# Patient Record
Sex: Female | Born: 1945
Health system: Southern US, Community
[De-identification: ages and names within clinical notes are randomized; demographics above are authoritative.]

## PROBLEM LIST (undated history)

## (undated) DIAGNOSIS — G2581 Restless legs syndrome: Secondary | ICD-10-CM

## (undated) DIAGNOSIS — D649 Anemia, unspecified: Secondary | ICD-10-CM

## (undated) DIAGNOSIS — C801 Malignant (primary) neoplasm, unspecified: Secondary | ICD-10-CM

## (undated) DIAGNOSIS — E785 Hyperlipidemia, unspecified: Secondary | ICD-10-CM

## (undated) DIAGNOSIS — Z9221 Personal history of antineoplastic chemotherapy: Secondary | ICD-10-CM

## (undated) DIAGNOSIS — M1990 Unspecified osteoarthritis, unspecified site: Secondary | ICD-10-CM

## (undated) DIAGNOSIS — G629 Polyneuropathy, unspecified: Secondary | ICD-10-CM

## (undated) DIAGNOSIS — C50919 Malignant neoplasm of unspecified site of unspecified female breast: Secondary | ICD-10-CM

## (undated) DIAGNOSIS — I10 Essential (primary) hypertension: Secondary | ICD-10-CM

## (undated) DIAGNOSIS — E039 Hypothyroidism, unspecified: Secondary | ICD-10-CM

## (undated) DIAGNOSIS — R42 Dizziness and giddiness: Secondary | ICD-10-CM

## (undated) DIAGNOSIS — E119 Type 2 diabetes mellitus without complications: Secondary | ICD-10-CM

## (undated) DIAGNOSIS — Z923 Personal history of irradiation: Secondary | ICD-10-CM

## (undated) DIAGNOSIS — C20 Malignant neoplasm of rectum: Secondary | ICD-10-CM

## (undated) DIAGNOSIS — G4700 Insomnia, unspecified: Secondary | ICD-10-CM

## (undated) DIAGNOSIS — K219 Gastro-esophageal reflux disease without esophagitis: Secondary | ICD-10-CM

## (undated) DIAGNOSIS — Z8619 Personal history of other infectious and parasitic diseases: Secondary | ICD-10-CM

## (undated) HISTORY — DX: Personal history of antineoplastic chemotherapy: Z92.21

## (undated) HISTORY — PX: COLONOSCOPY: SHX174

## (undated) HISTORY — DX: Essential (primary) hypertension: I10

## (undated) HISTORY — PX: ABDOMINAL HYSTERECTOMY: SHX81

## (undated) HISTORY — DX: Malignant (primary) neoplasm, unspecified: C80.1

## (undated) HISTORY — DX: Anemia, unspecified: D64.9

## (undated) HISTORY — DX: Personal history of other infectious and parasitic diseases: Z86.19

## (undated) HISTORY — DX: Malignant neoplasm of rectum: C20

## (undated) HISTORY — DX: Hyperlipidemia, unspecified: E78.5

## (undated) HISTORY — DX: Personal history of irradiation: Z92.3

## (undated) HISTORY — DX: Type 2 diabetes mellitus without complications: E11.9

## (undated) HISTORY — DX: Gastro-esophageal reflux disease without esophagitis: K21.9

## (undated) HISTORY — DX: Insomnia, unspecified: G47.00

---

## 1972-07-03 HISTORY — PX: DILATION AND CURETTAGE OF UTERUS: SHX78

## 1998-07-03 HISTORY — PX: ROTATOR CUFF REPAIR: SHX139

## 2000-04-30 ENCOUNTER — Encounter: Admission: RE | Admit: 2000-04-30 | Discharge: 2000-04-30 | Payer: Self-pay

## 2000-06-22 ENCOUNTER — Encounter: Admission: RE | Admit: 2000-06-22 | Discharge: 2000-06-22 | Payer: Self-pay

## 2000-07-27 ENCOUNTER — Encounter: Admission: RE | Admit: 2000-07-27 | Discharge: 2000-07-27 | Payer: Self-pay

## 2000-07-30 ENCOUNTER — Encounter: Admission: RE | Admit: 2000-07-30 | Discharge: 2000-07-30 | Payer: Self-pay

## 2000-11-08 ENCOUNTER — Encounter: Admission: RE | Admit: 2000-11-08 | Discharge: 2000-11-08 | Payer: Self-pay

## 2000-12-31 ENCOUNTER — Encounter: Admission: RE | Admit: 2000-12-31 | Discharge: 2000-12-31 | Payer: Self-pay

## 2001-07-10 ENCOUNTER — Encounter: Admission: RE | Admit: 2001-07-10 | Discharge: 2001-07-10 | Payer: Self-pay

## 2007-07-04 DIAGNOSIS — C20 Malignant neoplasm of rectum: Secondary | ICD-10-CM

## 2007-07-04 DIAGNOSIS — C50919 Malignant neoplasm of unspecified site of unspecified female breast: Secondary | ICD-10-CM

## 2007-07-04 HISTORY — DX: Malignant neoplasm of rectum: C20

## 2007-07-04 HISTORY — DX: Malignant neoplasm of unspecified site of unspecified female breast: C50.919

## 2007-07-04 HISTORY — PX: BREAST LUMPECTOMY: SHX2

## 2007-07-04 HISTORY — PX: BREAST SURGERY: SHX581

## 2008-04-02 ENCOUNTER — Ambulatory Visit: Payer: Self-pay | Admitting: Internal Medicine

## 2008-04-24 ENCOUNTER — Ambulatory Visit: Payer: Self-pay | Admitting: Internal Medicine

## 2008-04-30 ENCOUNTER — Ambulatory Visit: Payer: Self-pay | Admitting: Internal Medicine

## 2008-05-01 ENCOUNTER — Ambulatory Visit (HOSPITAL_COMMUNITY): Admission: RE | Admit: 2008-05-01 | Discharge: 2008-05-01 | Payer: Self-pay | Admitting: Gastroenterology

## 2008-05-03 ENCOUNTER — Ambulatory Visit: Payer: Self-pay | Admitting: Radiation Oncology

## 2008-05-14 ENCOUNTER — Ambulatory Visit: Payer: Self-pay | Admitting: General Surgery

## 2008-05-19 ENCOUNTER — Ambulatory Visit: Payer: Self-pay | Admitting: General Surgery

## 2008-06-02 ENCOUNTER — Ambulatory Visit: Payer: Self-pay | Admitting: Radiation Oncology

## 2008-06-08 ENCOUNTER — Ambulatory Visit: Payer: Self-pay | Admitting: General Surgery

## 2008-06-17 ENCOUNTER — Ambulatory Visit: Payer: Self-pay | Admitting: Internal Medicine

## 2008-07-03 ENCOUNTER — Ambulatory Visit: Payer: Self-pay | Admitting: Radiation Oncology

## 2008-07-03 ENCOUNTER — Ambulatory Visit: Payer: Self-pay | Admitting: Internal Medicine

## 2008-07-03 HISTORY — PX: COLOSTOMY: SHX63

## 2008-08-03 ENCOUNTER — Ambulatory Visit: Payer: Self-pay | Admitting: Internal Medicine

## 2008-08-03 ENCOUNTER — Ambulatory Visit: Payer: Self-pay | Admitting: Radiation Oncology

## 2008-08-31 ENCOUNTER — Ambulatory Visit: Payer: Self-pay | Admitting: Radiation Oncology

## 2008-08-31 ENCOUNTER — Ambulatory Visit: Payer: Self-pay | Admitting: Internal Medicine

## 2008-10-01 ENCOUNTER — Ambulatory Visit: Payer: Self-pay | Admitting: Internal Medicine

## 2008-10-01 ENCOUNTER — Ambulatory Visit: Payer: Self-pay | Admitting: Radiation Oncology

## 2008-10-14 ENCOUNTER — Ambulatory Visit: Payer: Self-pay | Admitting: General Surgery

## 2008-10-21 ENCOUNTER — Inpatient Hospital Stay: Payer: Self-pay | Admitting: General Surgery

## 2008-12-31 ENCOUNTER — Ambulatory Visit: Payer: Self-pay | Admitting: Internal Medicine

## 2009-01-08 ENCOUNTER — Ambulatory Visit: Payer: Self-pay | Admitting: Internal Medicine

## 2009-01-31 ENCOUNTER — Ambulatory Visit: Payer: Self-pay | Admitting: Internal Medicine

## 2009-03-03 ENCOUNTER — Ambulatory Visit: Payer: Self-pay | Admitting: Internal Medicine

## 2009-04-02 ENCOUNTER — Ambulatory Visit: Payer: Self-pay | Admitting: Internal Medicine

## 2009-05-03 ENCOUNTER — Ambulatory Visit: Payer: Self-pay | Admitting: Internal Medicine

## 2009-06-02 ENCOUNTER — Ambulatory Visit: Payer: Self-pay | Admitting: Internal Medicine

## 2009-07-03 ENCOUNTER — Ambulatory Visit: Payer: Self-pay | Admitting: Internal Medicine

## 2009-07-21 ENCOUNTER — Ambulatory Visit: Payer: Self-pay | Admitting: General Surgery

## 2009-08-03 ENCOUNTER — Ambulatory Visit: Payer: Self-pay | Admitting: Internal Medicine

## 2009-08-31 ENCOUNTER — Ambulatory Visit: Payer: Self-pay | Admitting: Internal Medicine

## 2009-09-18 ENCOUNTER — Emergency Department: Payer: Self-pay | Admitting: Emergency Medicine

## 2009-10-01 ENCOUNTER — Ambulatory Visit: Payer: Self-pay | Admitting: Internal Medicine

## 2009-10-31 ENCOUNTER — Ambulatory Visit: Payer: Self-pay | Admitting: Internal Medicine

## 2009-12-01 ENCOUNTER — Ambulatory Visit: Payer: Self-pay | Admitting: Internal Medicine

## 2009-12-02 ENCOUNTER — Ambulatory Visit: Payer: Self-pay | Admitting: Orthopedic Surgery

## 2009-12-07 ENCOUNTER — Ambulatory Visit: Payer: Self-pay | Admitting: Internal Medicine

## 2009-12-14 ENCOUNTER — Ambulatory Visit: Payer: Self-pay | Admitting: Internal Medicine

## 2009-12-31 ENCOUNTER — Ambulatory Visit: Payer: Self-pay | Admitting: Internal Medicine

## 2010-01-31 ENCOUNTER — Ambulatory Visit: Payer: Self-pay | Admitting: Internal Medicine

## 2010-02-01 ENCOUNTER — Ambulatory Visit: Payer: Self-pay

## 2010-03-03 ENCOUNTER — Ambulatory Visit: Payer: Self-pay | Admitting: Internal Medicine

## 2010-04-06 ENCOUNTER — Ambulatory Visit: Payer: Self-pay | Admitting: Internal Medicine

## 2010-05-03 ENCOUNTER — Ambulatory Visit: Payer: Self-pay | Admitting: Internal Medicine

## 2010-06-02 ENCOUNTER — Ambulatory Visit: Payer: Self-pay | Admitting: Internal Medicine

## 2010-06-17 LAB — CEA: CEA: <0.3 ng/mL (ref 0.0–4.7)

## 2010-07-03 ENCOUNTER — Ambulatory Visit: Payer: Self-pay | Admitting: Internal Medicine

## 2010-07-03 HISTORY — PX: BREAST BIOPSY: SHX20

## 2010-08-03 ENCOUNTER — Ambulatory Visit: Payer: Self-pay | Admitting: Internal Medicine

## 2010-08-11 ENCOUNTER — Ambulatory Visit: Payer: Self-pay

## 2010-09-07 ENCOUNTER — Ambulatory Visit: Payer: Self-pay | Admitting: Internal Medicine

## 2010-10-02 ENCOUNTER — Ambulatory Visit: Payer: Self-pay | Admitting: Internal Medicine

## 2010-11-01 ENCOUNTER — Ambulatory Visit: Payer: Self-pay | Admitting: Internal Medicine

## 2011-01-18 ENCOUNTER — Ambulatory Visit: Payer: Self-pay | Admitting: Internal Medicine

## 2011-01-19 LAB — CEA: CEA: 1.2 ng/mL (ref 0.0–4.7)

## 2011-02-01 ENCOUNTER — Ambulatory Visit: Payer: Self-pay | Admitting: Internal Medicine

## 2011-02-08 ENCOUNTER — Ambulatory Visit: Payer: Self-pay

## 2011-04-19 ENCOUNTER — Ambulatory Visit: Payer: Self-pay | Admitting: Internal Medicine

## 2011-04-20 LAB — CEA: CEA: 1.1 ng/mL (ref 0.0–4.7)

## 2011-05-04 ENCOUNTER — Ambulatory Visit: Payer: Self-pay | Admitting: Internal Medicine

## 2011-06-03 ENCOUNTER — Ambulatory Visit: Payer: Self-pay | Admitting: Internal Medicine

## 2011-07-12 ENCOUNTER — Ambulatory Visit: Payer: Self-pay | Admitting: Internal Medicine

## 2011-08-04 ENCOUNTER — Ambulatory Visit: Payer: Self-pay | Admitting: Internal Medicine

## 2011-08-14 ENCOUNTER — Ambulatory Visit: Payer: Self-pay

## 2011-09-01 ENCOUNTER — Ambulatory Visit: Payer: Self-pay | Admitting: Internal Medicine

## 2011-10-11 ENCOUNTER — Ambulatory Visit: Payer: Self-pay | Admitting: Internal Medicine

## 2011-10-11 LAB — CBC CANCER CENTER
Basophil #: 0 "x10 3/mm "
Basophil %: 0.2 %
Eosinophil #: 0.1 "x10 3/mm "
Eosinophil %: 1.6 %
HCT: 36 %
HGB: 12.3 g/dL
Lymphocyte %: 29.2 %
Lymphs Abs: 1.6 "x10 3/mm "
MCH: 31.8 pg
MCHC: 34.1 g/dL
MCV: 93 fL
Monocyte #: 0.5 "x10 3/mm "
Monocyte %: 8.4 %
Neutrophil #: 3.3 "x10 3/mm "
Neutrophil %: 60.6 %
Platelet: 206 "x10 3/mm "
RBC: 3.86 "x10 6/mm "
RDW: 13.5 %
WBC: 5.5 "x10 3/mm "

## 2011-10-11 LAB — HEPATIC FUNCTION PANEL A (ARMC)
Albumin: 4.2 g/dL (ref 3.4–5.0)
Alkaline Phosphatase: 42 U/L — ABNORMAL LOW (ref 50–136)
Bilirubin, Direct: <0.1 mg/dL (ref 0.00–0.20)
Bilirubin,Total: 0.3 mg/dL (ref 0.2–1.0)
SGOT(AST): 47 U/L — ABNORMAL HIGH (ref 15–37)
SGPT (ALT): 30 U/L
Total Protein: 6.9 g/dL (ref 6.4–8.2)

## 2011-11-01 ENCOUNTER — Ambulatory Visit: Payer: Self-pay | Admitting: Internal Medicine

## 2011-11-22 LAB — HEPATIC FUNCTION PANEL A (ARMC)
Albumin: 4.1 g/dL (ref 3.4–5.0)
Bilirubin,Total: 0.4 mg/dL (ref 0.2–1.0)
SGOT(AST): 47 U/L — ABNORMAL HIGH (ref 15–37)
Total Protein: 7.3 g/dL (ref 6.4–8.2)

## 2011-12-02 ENCOUNTER — Ambulatory Visit: Payer: Self-pay | Admitting: Internal Medicine

## 2012-01-03 ENCOUNTER — Ambulatory Visit: Payer: Self-pay | Admitting: Internal Medicine

## 2012-02-01 ENCOUNTER — Ambulatory Visit: Payer: Self-pay | Admitting: Internal Medicine

## 2012-02-14 ENCOUNTER — Ambulatory Visit: Payer: Self-pay | Admitting: General Surgery

## 2012-02-14 LAB — CBC CANCER CENTER
Basophil %: 0.8 %
Eosinophil %: 1.2 %
HCT: 39.4 % (ref 35.0–47.0)
Lymphocyte #: 1.8 x10 3/mm (ref 1.0–3.6)
Lymphocyte %: 30.5 %
MCHC: 33 g/dL (ref 32.0–36.0)
MCV: 94 fL (ref 80–100)
Monocyte %: 8.3 %
Neutrophil #: 3.5 x10 3/mm (ref 1.4–6.5)
RBC: 4.19 10*6/uL (ref 3.80–5.20)
RDW: 13.3 % (ref 11.5–14.5)

## 2012-02-14 LAB — HEPATIC FUNCTION PANEL A (ARMC)
Albumin: 4.1 g/dL (ref 3.4–5.0)
Bilirubin, Direct: 0.1 mg/dL (ref 0.00–0.20)
SGOT(AST): 45 U/L — ABNORMAL HIGH (ref 15–37)
SGPT (ALT): 37 U/L (ref 12–78)

## 2012-03-03 ENCOUNTER — Ambulatory Visit: Payer: Self-pay | Admitting: Internal Medicine

## 2012-05-08 ENCOUNTER — Ambulatory Visit: Payer: Self-pay | Admitting: Internal Medicine

## 2012-05-08 LAB — HEPATIC FUNCTION PANEL A (ARMC)
Albumin: 4.3 g/dL (ref 3.4–5.0)
Alkaline Phosphatase: 41 U/L — ABNORMAL LOW (ref 50–136)
Bilirubin,Total: 0.3 mg/dL (ref 0.2–1.0)
SGOT(AST): 29 U/L (ref 15–37)
SGPT (ALT): 24 U/L (ref 12–78)

## 2012-05-08 LAB — CBC CANCER CENTER
Basophil #: 0 x10 3/mm (ref 0.0–0.1)
HCT: 36.5 % (ref 35.0–47.0)
Lymphocyte #: 1.7 x10 3/mm (ref 1.0–3.6)
MCH: 31.4 pg (ref 26.0–34.0)
MCV: 95 fL (ref 80–100)
Monocyte #: 0.4 x10 3/mm (ref 0.2–0.9)
Neutrophil #: 2.3 x10 3/mm (ref 1.4–6.5)
Platelet: 245 x10 3/mm (ref 150–440)
RDW: 13.4 % (ref 11.5–14.5)
WBC: 4.5 x10 3/mm (ref 3.6–11.0)

## 2012-05-08 LAB — CREATININE, SERUM
Creatinine: 1.3 mg/dL (ref 0.60–1.30)
EGFR (Non-African Amer.): 43 — ABNORMAL LOW

## 2012-06-02 ENCOUNTER — Ambulatory Visit: Payer: Self-pay | Admitting: Internal Medicine

## 2012-07-03 ENCOUNTER — Ambulatory Visit: Payer: Self-pay | Admitting: Internal Medicine

## 2012-09-10 ENCOUNTER — Ambulatory Visit: Payer: Self-pay | Admitting: Internal Medicine

## 2012-10-01 ENCOUNTER — Ambulatory Visit: Payer: Self-pay | Admitting: Internal Medicine

## 2012-10-23 LAB — HEPATIC FUNCTION PANEL A (ARMC)
Albumin: 4.2 g/dL (ref 3.4–5.0)
Alkaline Phosphatase: 34 U/L — ABNORMAL LOW (ref 50–136)
Bilirubin, Direct: 0.1 mg/dL (ref 0.00–0.20)
SGPT (ALT): 22 U/L (ref 12–78)
Total Protein: 6.8 g/dL (ref 6.4–8.2)

## 2012-10-23 LAB — CBC CANCER CENTER
Basophil %: 0.3 %
Eosinophil #: 0.1 x10 3/mm (ref 0.0–0.7)
HGB: 12.1 g/dL (ref 12.0–16.0)
Lymphocyte #: 1.5 x10 3/mm (ref 1.0–3.6)
MCH: 31 pg (ref 26.0–34.0)
MCV: 91 fL (ref 80–100)
Monocyte #: 0.4 x10 3/mm (ref 0.2–0.9)
Neutrophil %: 61.9 %
RBC: 3.89 10*6/uL (ref 3.80–5.20)
RDW: 13.3 % (ref 11.5–14.5)
WBC: 5.3 x10 3/mm (ref 3.6–11.0)

## 2012-10-23 LAB — CREATININE, SERUM: EGFR (African American): 50 — ABNORMAL LOW

## 2012-10-24 LAB — CEA: CEA: 0.8 ng/mL (ref 0.0–4.7)

## 2012-10-31 ENCOUNTER — Ambulatory Visit: Payer: Self-pay | Admitting: Internal Medicine

## 2012-12-01 ENCOUNTER — Ambulatory Visit: Payer: Self-pay | Admitting: Internal Medicine

## 2013-02-19 ENCOUNTER — Ambulatory Visit: Payer: Self-pay | Admitting: Internal Medicine

## 2013-05-14 ENCOUNTER — Ambulatory Visit: Payer: Self-pay | Admitting: Internal Medicine

## 2013-05-14 LAB — CBC CANCER CENTER
Basophil #: 0 x10 3/mm (ref 0.0–0.1)
Basophil %: 0.9 %
Eosinophil #: 0.1 x10 3/mm (ref 0.0–0.7)
Eosinophil %: 1.5 %
Lymphocyte %: 38.9 %
MCHC: 34.3 g/dL (ref 32.0–36.0)
MCV: 92 fL (ref 80–100)
Monocyte %: 7.9 %
Platelet: 302 x10 3/mm (ref 150–440)
RBC: 4.07 10*6/uL (ref 3.80–5.20)
RDW: 13.1 % (ref 11.5–14.5)

## 2013-05-14 LAB — CREATININE, SERUM
EGFR (African American): 55 — ABNORMAL LOW
EGFR (Non-African Amer.): 47 — ABNORMAL LOW

## 2013-05-14 LAB — HEPATIC FUNCTION PANEL A (ARMC)
Albumin: 4.3 g/dL (ref 3.4–5.0)
Alkaline Phosphatase: 39 U/L — ABNORMAL LOW (ref 50–136)
Bilirubin, Direct: <0.1 mg/dL (ref 0.00–0.20)
Bilirubin,Total: 0.3 mg/dL (ref 0.2–1.0)
SGOT(AST): 31 U/L (ref 15–37)
SGPT (ALT): 21 U/L (ref 12–78)
Total Protein: 7.5 g/dL (ref 6.4–8.2)

## 2013-05-14 LAB — CALCIUM: Calcium, Total: 9.4 mg/dL (ref 8.5–10.1)

## 2013-05-15 LAB — CEA: CEA: 0.6 ng/mL (ref 0.0–4.7)

## 2013-06-02 ENCOUNTER — Ambulatory Visit: Payer: Self-pay | Admitting: Internal Medicine

## 2013-07-08 ENCOUNTER — Ambulatory Visit: Payer: Self-pay | Admitting: Internal Medicine

## 2013-08-03 ENCOUNTER — Ambulatory Visit: Payer: Self-pay | Admitting: Internal Medicine

## 2013-09-01 ENCOUNTER — Ambulatory Visit: Payer: Self-pay | Admitting: Internal Medicine

## 2013-10-01 ENCOUNTER — Ambulatory Visit: Payer: Self-pay | Admitting: Internal Medicine

## 2013-10-29 LAB — CBC CANCER CENTER
Basophil #: 0 x10 3/mm (ref 0.0–0.1)
Basophil %: 0.8 %
EOS PCT: 1.5 %
Eosinophil #: 0.1 x10 3/mm (ref 0.0–0.7)
HCT: 35.4 % (ref 35.0–47.0)
HGB: 12.4 g/dL (ref 12.0–16.0)
Lymphocyte #: 1.9 x10 3/mm (ref 1.0–3.6)
Lymphocyte %: 40.3 %
MCH: 30.9 pg (ref 26.0–34.0)
MCHC: 35.1 g/dL (ref 32.0–36.0)
MCV: 88 fL (ref 80–100)
MONOS PCT: 7.8 %
Monocyte #: 0.4 x10 3/mm (ref 0.2–0.9)
NEUTROS PCT: 49.6 %
Neutrophil #: 2.4 x10 3/mm (ref 1.4–6.5)
Platelet: 268 x10 3/mm (ref 150–440)
RBC: 4.02 10*6/uL (ref 3.80–5.20)
RDW: 13 % (ref 11.5–14.5)
WBC: 4.8 x10 3/mm (ref 3.6–11.0)

## 2013-10-29 LAB — CREATININE, SERUM
Creatinine: 1.01 mg/dL (ref 0.60–1.30)
EGFR (African American): >60
GFR CALC NON AF AMER: 58 — AB

## 2013-10-29 LAB — HEPATIC FUNCTION PANEL A (ARMC)
ALK PHOS: 52 U/L
AST: 31 U/L (ref 15–37)
Albumin: 4.3 g/dL (ref 3.4–5.0)
BILIRUBIN DIRECT: 0.1 mg/dL (ref 0.00–0.20)
Bilirubin,Total: 0.3 mg/dL (ref 0.2–1.0)
SGPT (ALT): 22 U/L (ref 12–78)
TOTAL PROTEIN: 7.6 g/dL (ref 6.4–8.2)

## 2013-10-29 LAB — CALCIUM: Calcium, Total: 9.7 mg/dL (ref 8.5–10.1)

## 2013-10-30 LAB — CEA: CEA: 1.4 ng/mL (ref 0.0–4.7)

## 2013-10-31 ENCOUNTER — Ambulatory Visit: Payer: Self-pay | Admitting: Internal Medicine

## 2013-11-13 ENCOUNTER — Ambulatory Visit: Payer: Self-pay | Admitting: Orthopedic Surgery

## 2013-12-10 ENCOUNTER — Ambulatory Visit: Payer: Self-pay | Admitting: Internal Medicine

## 2013-12-30 ENCOUNTER — Encounter: Payer: Self-pay | Admitting: General Surgery

## 2013-12-31 ENCOUNTER — Ambulatory Visit: Payer: Self-pay | Admitting: Internal Medicine

## 2014-01-01 ENCOUNTER — Ambulatory Visit: Payer: Self-pay | Admitting: General Surgery

## 2014-01-05 ENCOUNTER — Encounter: Payer: Self-pay | Admitting: General Surgery

## 2014-01-05 ENCOUNTER — Ambulatory Visit (INDEPENDENT_AMBULATORY_CARE_PROVIDER_SITE_OTHER): Payer: Medicare Other | Admitting: General Surgery

## 2014-01-05 VITALS — BP 124/70 | HR 88 | Resp 16 | Ht 63.0 in | Wt 200.0 lb

## 2014-01-05 DIAGNOSIS — C20 Malignant neoplasm of rectum: Secondary | ICD-10-CM | POA: Insufficient documentation

## 2014-01-05 DIAGNOSIS — C50919 Malignant neoplasm of unspecified site of unspecified female breast: Secondary | ICD-10-CM

## 2014-01-05 NOTE — Progress Notes (Signed)
Patient ID: Shannon Chavez, female   DOB: 07-02-1946, 67 y.o.   MRN: [id number]  Chief Complaint  Patient presents with  . Other    port removal    HPI Shannon Chavez is a 68 y.o. female here today for a port removal. Request for port removal has been received from Shannon Chavez, M.D.  HPI  Past Medical History  Diagnosis Date  . Diabetes mellitus without complication   . Hypertension   . GERD (gastroesophageal reflux disease)   . Anemia   . Insomnia   . Hyperlipidemia   . Cancer     skin  . Cancer     breast    Past Surgical History  Procedure Laterality Date  . Colonoscopy    . Rotator cuff repair  2000  . Breast surgery  2009    lumpectomy  . Colostomy  2010  . Dilation and curettage of uterus  1974    Family History  Problem Relation Age of Onset  . Breast cancer Mother     Social History History  Substance Use Topics  . Smoking status: Never Smoker   . Smokeless tobacco: Never Used  . Alcohol Use: No    Allergies  Allergen Reactions  . Amitriptyline Itching  . Aspirin Nausea And Vomiting  . Chlorhexidine Gluconate Hives    Chlor Prep  . Ciprofloxacin Itching  . Codeine Itching  . Lipitor [Atorvastatin]     Dizzyness   . Metoprolol     Paresthesia in hands  . Niacin And Related Itching  . Other Itching    DARVOCET  . Penicillins Hives and Diarrhea  . Sulfa Antibiotics Itching  . Tramadol Hives  . Aloe Vera Itching and Rash    Current Outpatient Prescriptions  Medication Sig Dispense Refill  . acetaminophen (RA ACETAMINOPHEN) 650 MG CR tablet Take 650 mg by mouth as needed.      . bisacodyl (STIMULANT LAXATIVE) 5 MG EC tablet Take 5 mg by mouth as needed.      . calcium elemental as carbonate (PX ANTACID MAXIMUM STRENGTH) 400 MG tablet Chew 1,000 mg by mouth as needed.      . Cetirizine HCl 10 MG CAPS Take 10 mg by mouth daily.      . Cholecalciferol (VITAMIN D3) 1000 UNITS CAPS Take 1 capsule by mouth daily.      Marland Kitchen esomeprazole (NEXIUM) 40 MG  capsule Take 40 mg by mouth daily.      . Flaxseed, Linseed, (FLAXSEED OIL) 1000 MG CAPS Take 1,200 mg by mouth daily.      Marland Kitchen lisinopril (PRINIVIL,ZESTRIL) 40 MG tablet Take 40 mg by mouth daily.      . meclizine (ANTIVERT) 25 MG tablet Take 25 mg by mouth as needed.      . metFORMIN (GLUCOPHAGE-XR) 500 MG 24 hr tablet Take 1,500 mg by mouth daily.      . Multiple Vitamin (MULTI-VITAMINS) TABS Take by mouth daily.      . promethazine (PHENERGAN) 25 MG tablet Take 25 mg by mouth 4 (four) times daily as needed.      . Simethicone 125 MG CAPS Take 125 mg by mouth as needed.      . triamterene-hydrochlorothiazide (MAXZIDE) 75-50 MG per tablet Take by mouth daily.      . triazolam (HALCION) 0.25 MG tablet Take 0.25 mg by mouth at bedtime as needed.       No current facility-administered medications for this visit.    Review  of Systems Review of Systems  Constitutional: Negative.   Respiratory: Negative.   Cardiovascular: Negative.     Blood pressure 124/70, pulse 88, resp. rate 16, height 5\' 3"  (1.6 m), weight 200 lb (90.719 kg).  Physical Exam Physical Exam Examination of the right chest wall shows no evidence of infection or inflammation.    Assessment    No further need for central venous access.     Plan    The procedure was reviewed with the patient and she was amenable to proceed. The area was prepped with alcohol followed by 10 cc of 0.5% Xylocaine with 0.25% Marcaine with 1-200,000 units of epinephrine. ChloraPrep was applied to the skin. The previous incision was opened and the port exposed. Fixation sutures were removed. The port was removed without difficulty. An intact catheter tip was noted. The wound was closed in layers with running 3-0 Vicryl to the adipose layer as well as a running 3-0 Vicryl subcuticular suture for the skin. Benzoin and Steri-Strips followed by Telfa and Tegaderm dressing was applied. Ice pack was provided. Postop instructions for wound care were  reviewed. The patient will return in one week for examination with the staff.     PCP: None    Shannon Chavez 01/05/2014, 8:15 PM

## 2014-01-05 NOTE — Patient Instructions (Addendum)
Patient to return in 1 week for nurse visit. The patient is aware to call back for any questions or concerns.  

## 2014-01-12 ENCOUNTER — Ambulatory Visit (INDEPENDENT_AMBULATORY_CARE_PROVIDER_SITE_OTHER): Payer: Self-pay | Admitting: *Deleted

## 2014-01-12 DIAGNOSIS — C50919 Malignant neoplasm of unspecified site of unspecified female breast: Secondary | ICD-10-CM

## 2014-01-12 DIAGNOSIS — C20 Malignant neoplasm of rectum: Secondary | ICD-10-CM

## 2014-01-12 NOTE — Progress Notes (Signed)
Patient came in today for a wound check.  The wound is clean, with no signs of infection noted. Steri strips still in place. Advised will fall off in approximately 1 week.

## 2014-02-25 ENCOUNTER — Ambulatory Visit: Payer: Self-pay | Admitting: Internal Medicine

## 2014-04-15 ENCOUNTER — Ambulatory Visit: Payer: Self-pay | Admitting: Internal Medicine

## 2014-04-15 LAB — HEPATIC FUNCTION PANEL A (ARMC)
ALK PHOS: 46 U/L
Albumin: 3.9 g/dL (ref 3.4–5.0)
BILIRUBIN DIRECT: 0.2 mg/dL (ref 0.00–0.20)
Bilirubin,Total: 0.3 mg/dL (ref 0.2–1.0)
SGOT(AST): 29 U/L (ref 15–37)
SGPT (ALT): 22 U/L
Total Protein: 6.9 g/dL (ref 6.4–8.2)

## 2014-04-15 LAB — CBC CANCER CENTER
BASOS ABS: 0 x10 3/mm (ref 0.0–0.1)
BASOS PCT: 0.3 %
EOS PCT: 1.2 %
Eosinophil #: 0.1 x10 3/mm (ref 0.0–0.7)
HCT: 37.8 % (ref 35.0–47.0)
HGB: 12.3 g/dL (ref 12.0–16.0)
Lymphocyte #: 1.4 x10 3/mm (ref 1.0–3.6)
Lymphocyte %: 17.8 %
MCH: 29.4 pg (ref 26.0–34.0)
MCHC: 32.5 g/dL (ref 32.0–36.0)
MCV: 91 fL (ref 80–100)
MONO ABS: 0.4 x10 3/mm (ref 0.2–0.9)
Monocyte %: 5.5 %
NEUTROS ABS: 6 x10 3/mm (ref 1.4–6.5)
NEUTROS PCT: 75.2 %
PLATELETS: 254 x10 3/mm (ref 150–440)
RBC: 4.18 10*6/uL (ref 3.80–5.20)
RDW: 13.9 % (ref 11.5–14.5)
WBC: 8 x10 3/mm (ref 3.6–11.0)

## 2014-04-15 LAB — CREATININE, SERUM
CREATININE: 1.03 mg/dL (ref 0.60–1.30)
EGFR (African American): >60
EGFR (Non-African Amer.): 57 — ABNORMAL LOW

## 2014-04-16 LAB — CEA: CEA: 0.9 ng/mL (ref 0.0–4.7)

## 2014-05-03 ENCOUNTER — Ambulatory Visit: Payer: Self-pay | Admitting: Internal Medicine

## 2014-05-04 ENCOUNTER — Encounter: Payer: Self-pay | Admitting: General Surgery

## 2014-10-07 ENCOUNTER — Ambulatory Visit
Admit: 2014-10-07 | Disposition: A | Discharge status: Home / Self Care | Payer: Self-pay | Attending: Internal Medicine | Admitting: Internal Medicine

## 2014-10-07 LAB — CBC CANCER CENTER
Basophil #: 0 x10 3/mm (ref 0.0–0.1)
Basophil %: 0.4 %
Eosinophil #: 0.1 x10 3/mm (ref 0.0–0.7)
Eosinophil %: 1.7 %
HCT: 36.8 % (ref 35.0–47.0)
HGB: 12.4 g/dL (ref 12.0–16.0)
Lymphocyte #: 1.7 x10 3/mm (ref 1.0–3.6)
Lymphocyte %: 28.7 %
MCH: 29.9 pg (ref 26.0–34.0)
MCHC: 33.8 g/dL (ref 32.0–36.0)
MCV: 89 fL (ref 80–100)
Monocyte #: 0.4 x10 3/mm (ref 0.2–0.9)
Monocyte %: 7.1 %
Neutrophil #: 3.8 x10 3/mm (ref 1.4–6.5)
Neutrophil %: 62.1 %
PLATELETS: 275 x10 3/mm (ref 150–440)
RBC: 4.15 10*6/uL (ref 3.80–5.20)
RDW: 13.9 % (ref 11.5–14.5)
WBC: 6 x10 3/mm (ref 3.6–11.0)

## 2014-10-07 LAB — HEPATIC FUNCTION PANEL A (ARMC)
ALK PHOS: 35 U/L — AB
ALT: 18 U/L
Albumin: 4.5 g/dL
Bilirubin, Direct: <0.1 mg/dL
Bilirubin,Total: 0.5 mg/dL
SGOT(AST): 29 U/L
Total Protein: 7 g/dL

## 2014-10-07 LAB — CREATININE, SERUM
Creatinine: 0.89 mg/dL
EGFR (Non-African Amer.): >60

## 2014-10-07 LAB — CALCIUM: Calcium, Total: 9 mg/dL

## 2014-10-08 LAB — CEA: CEA: 1.1 ng/mL (ref 0.0–4.7)

## 2014-10-16 ENCOUNTER — Other Ambulatory Visit: Payer: Self-pay | Admitting: Internal Medicine

## 2014-10-16 DIAGNOSIS — Z853 Personal history of malignant neoplasm of breast: Secondary | ICD-10-CM

## 2015-03-02 ENCOUNTER — Ambulatory Visit
Admission: RE | Admit: 2015-03-02 | Discharge: 2015-03-02 | Disposition: A | Discharge status: Home / Self Care | Payer: Medicare Other | Source: Ambulatory Visit | Attending: Internal Medicine | Admitting: Internal Medicine

## 2015-03-02 DIAGNOSIS — Z853 Personal history of malignant neoplasm of breast: Secondary | ICD-10-CM | POA: Diagnosis present

## 2015-03-02 HISTORY — DX: Malignant neoplasm of unspecified site of unspecified female breast: C50.919

## 2015-07-07 DIAGNOSIS — J209 Acute bronchitis, unspecified: Secondary | ICD-10-CM | POA: Diagnosis not present

## 2015-07-26 DIAGNOSIS — C20 Malignant neoplasm of rectum: Secondary | ICD-10-CM | POA: Diagnosis not present

## 2015-07-26 DIAGNOSIS — Z933 Colostomy status: Secondary | ICD-10-CM | POA: Diagnosis not present

## 2015-07-29 DIAGNOSIS — Z933 Colostomy status: Secondary | ICD-10-CM | POA: Diagnosis not present

## 2015-08-10 DIAGNOSIS — E782 Mixed hyperlipidemia: Secondary | ICD-10-CM | POA: Diagnosis not present

## 2015-08-10 DIAGNOSIS — E119 Type 2 diabetes mellitus without complications: Secondary | ICD-10-CM | POA: Diagnosis not present

## 2015-08-10 DIAGNOSIS — G4700 Insomnia, unspecified: Secondary | ICD-10-CM | POA: Diagnosis not present

## 2015-08-10 DIAGNOSIS — R42 Dizziness and giddiness: Secondary | ICD-10-CM | POA: Diagnosis not present

## 2015-08-10 DIAGNOSIS — R202 Paresthesia of skin: Secondary | ICD-10-CM | POA: Diagnosis not present

## 2015-08-10 DIAGNOSIS — I10 Essential (primary) hypertension: Secondary | ICD-10-CM | POA: Diagnosis not present

## 2015-08-10 DIAGNOSIS — Z79899 Other long term (current) drug therapy: Secondary | ICD-10-CM | POA: Diagnosis not present

## 2015-09-01 DIAGNOSIS — Z933 Colostomy status: Secondary | ICD-10-CM | POA: Diagnosis not present

## 2015-09-01 DIAGNOSIS — C20 Malignant neoplasm of rectum: Secondary | ICD-10-CM | POA: Diagnosis not present

## 2015-09-30 DIAGNOSIS — E039 Hypothyroidism, unspecified: Secondary | ICD-10-CM | POA: Diagnosis not present

## 2015-10-07 ENCOUNTER — Encounter: Payer: Self-pay | Admitting: *Deleted

## 2015-10-07 ENCOUNTER — Other Ambulatory Visit: Payer: Self-pay | Admitting: *Deleted

## 2015-10-07 DIAGNOSIS — C20 Malignant neoplasm of rectum: Secondary | ICD-10-CM

## 2015-10-07 DIAGNOSIS — C50919 Malignant neoplasm of unspecified site of unspecified female breast: Secondary | ICD-10-CM

## 2015-10-08 ENCOUNTER — Inpatient Hospital Stay (HOSPITAL_BASED_OUTPATIENT_CLINIC_OR_DEPARTMENT_OTHER): Payer: Medicare Other | Admitting: Internal Medicine

## 2015-10-08 ENCOUNTER — Inpatient Hospital Stay: Payer: Medicare Other | Attending: Internal Medicine

## 2015-10-08 ENCOUNTER — Telehealth: Payer: Self-pay | Admitting: Internal Medicine

## 2015-10-08 VITALS — BP 124/73 | HR 78 | Temp 98.4°F | Wt 196.4 lb

## 2015-10-08 DIAGNOSIS — I10 Essential (primary) hypertension: Secondary | ICD-10-CM | POA: Insufficient documentation

## 2015-10-08 DIAGNOSIS — G4700 Insomnia, unspecified: Secondary | ICD-10-CM

## 2015-10-08 DIAGNOSIS — Z801 Family history of malignant neoplasm of trachea, bronchus and lung: Secondary | ICD-10-CM | POA: Diagnosis not present

## 2015-10-08 DIAGNOSIS — E785 Hyperlipidemia, unspecified: Secondary | ICD-10-CM

## 2015-10-08 DIAGNOSIS — K219 Gastro-esophageal reflux disease without esophagitis: Secondary | ICD-10-CM

## 2015-10-08 DIAGNOSIS — Z7984 Long term (current) use of oral hypoglycemic drugs: Secondary | ICD-10-CM | POA: Insufficient documentation

## 2015-10-08 DIAGNOSIS — Z9221 Personal history of antineoplastic chemotherapy: Secondary | ICD-10-CM

## 2015-10-08 DIAGNOSIS — D649 Anemia, unspecified: Secondary | ICD-10-CM | POA: Diagnosis not present

## 2015-10-08 DIAGNOSIS — Z85828 Personal history of other malignant neoplasm of skin: Secondary | ICD-10-CM | POA: Diagnosis not present

## 2015-10-08 DIAGNOSIS — M818 Other osteoporosis without current pathological fracture: Secondary | ICD-10-CM | POA: Insufficient documentation

## 2015-10-08 DIAGNOSIS — Z800 Family history of malignant neoplasm of digestive organs: Secondary | ICD-10-CM | POA: Diagnosis not present

## 2015-10-08 DIAGNOSIS — M25559 Pain in unspecified hip: Secondary | ICD-10-CM | POA: Insufficient documentation

## 2015-10-08 DIAGNOSIS — G8929 Other chronic pain: Secondary | ICD-10-CM | POA: Diagnosis not present

## 2015-10-08 DIAGNOSIS — Z85048 Personal history of other malignant neoplasm of rectum, rectosigmoid junction, and anus: Secondary | ICD-10-CM | POA: Insufficient documentation

## 2015-10-08 DIAGNOSIS — Z808 Family history of malignant neoplasm of other organs or systems: Secondary | ICD-10-CM

## 2015-10-08 DIAGNOSIS — E119 Type 2 diabetes mellitus without complications: Secondary | ICD-10-CM | POA: Insufficient documentation

## 2015-10-08 DIAGNOSIS — Z79899 Other long term (current) drug therapy: Secondary | ICD-10-CM | POA: Diagnosis not present

## 2015-10-08 DIAGNOSIS — Z923 Personal history of irradiation: Secondary | ICD-10-CM | POA: Insufficient documentation

## 2015-10-08 DIAGNOSIS — G629 Polyneuropathy, unspecified: Secondary | ICD-10-CM

## 2015-10-08 DIAGNOSIS — C50919 Malignant neoplasm of unspecified site of unspecified female breast: Secondary | ICD-10-CM

## 2015-10-08 DIAGNOSIS — Z803 Family history of malignant neoplasm of breast: Secondary | ICD-10-CM | POA: Insufficient documentation

## 2015-10-08 DIAGNOSIS — C20 Malignant neoplasm of rectum: Secondary | ICD-10-CM

## 2015-10-08 DIAGNOSIS — Z853 Personal history of malignant neoplasm of breast: Secondary | ICD-10-CM

## 2015-10-08 LAB — CBC WITH DIFFERENTIAL/PLATELET
BASOS PCT: 0 %
Basophils Absolute: 0 10*3/uL (ref 0–0.1)
Eosinophils Absolute: 0.2 10*3/uL (ref 0–0.7)
Eosinophils Relative: 2 %
HCT: 34.2 % — ABNORMAL LOW (ref 35.0–47.0)
HEMOGLOBIN: 11.6 g/dL — AB (ref 12.0–16.0)
Lymphocytes Relative: 30 %
Lymphs Abs: 1.9 10*3/uL (ref 1.0–3.6)
MCH: 29.6 pg (ref 26.0–34.0)
MCHC: 33.8 g/dL (ref 32.0–36.0)
MCV: 87.8 fL (ref 80.0–100.0)
Monocytes Absolute: 0.4 10*3/uL (ref 0.2–0.9)
Monocytes Relative: 7 %
NEUTROS PCT: 61 %
Neutro Abs: 3.9 10*3/uL (ref 1.4–6.5)
Platelets: 260 10*3/uL (ref 150–440)
RBC: 3.9 MIL/uL (ref 3.80–5.20)
RDW: 14.4 % (ref 11.5–14.5)
WBC: 6.4 10*3/uL (ref 3.6–11.0)

## 2015-10-08 LAB — COMPREHENSIVE METABOLIC PANEL
ALK PHOS: 36 U/L — AB (ref 38–126)
ALT: 12 U/L — AB (ref 14–54)
AST: 24 U/L (ref 15–41)
Albumin: 4.6 g/dL (ref 3.5–5.0)
Anion gap: 9 (ref 5–15)
BUN: 21 mg/dL — ABNORMAL HIGH (ref 6–20)
CALCIUM: 9.1 mg/dL (ref 8.9–10.3)
CO2: 23 mmol/L (ref 22–32)
CREATININE: 1.06 mg/dL — AB (ref 0.44–1.00)
Chloride: 102 mmol/L (ref 101–111)
GFR calc non Af Amer: 53 mL/min — ABNORMAL LOW (ref >60)
GLUCOSE: 165 mg/dL — AB (ref 65–99)
Potassium: 4.3 mmol/L (ref 3.5–5.1)
SODIUM: 134 mmol/L — AB (ref 135–145)
Total Bilirubin: 0.5 mg/dL (ref 0.3–1.2)
Total Protein: 7.4 g/dL (ref 6.5–8.1)

## 2015-10-08 LAB — IRON AND TIBC
Iron: 81 ug/dL (ref 28–170)
SATURATION RATIOS: 17 % (ref 10.4–31.8)
TIBC: 468 ug/dL — AB (ref 250–450)
UIBC: 387 ug/dL

## 2015-10-08 LAB — FERRITIN: FERRITIN: 12 ng/mL (ref 11–307)

## 2015-10-08 LAB — LACTATE DEHYDROGENASE: LDH: 134 U/L (ref 98–192)

## 2015-10-08 NOTE — Progress Notes (Signed)
Iron studies suggestive of iron deficiency; recommend stool occult 2; and strongly recommend repeat colonoscopy given history of rectal cancer/iron deficiency. Also recommend PO iron pills twice a day. Patient will be informed.

## 2015-10-08 NOTE — Progress Notes (Signed)
[place] OFFICE PROGRESS NOTE  Patient Care Team: Leonides Sake, MD as PCP - General (Family Medicine) Leia Alf, MD as Attending Physician (Internal Medicine) Robert Bellow, MD (General Surgery)   SUMMARY OF ONCOLOGIC HISTORY:  # Oct 2009-RECTAL CANCER- STAGE IIIC [TxN2M0; Chemo-RT; April 2010- s/p APR with colostomy [dr.Byrnett]; s/p FOLFOX x12 [finished March 2011]  # BREAST CANCER [incidental PET]- Stage I [T=0.7cm; SLN-neg] ER/PR pos; Her 2 Neu-NEG. s/p Lumpec & mammosite   # PN-1 sec to chemo.   INTERVAL HISTORY:  This is my first interaction with the patient since I joined the practice September 2016. I reviewed the patient's prior charts/pertinent labs/imaging in detail; findings are summarized above.   70 year old female patient with above history of rectal cancer stage III and history of breast cancer stage I [2009] is here for follow-up.  Patient denies any abdominal pain. Denies any nausea vomiting. In any blood in stools or black stools. Denies any chest pain or shortness of breath or cough.  She has chronic tingling and numbness of her extremities. Patient has chronic hip pain not getting any worse.  REVIEW OF SYSTEMS:  A complete 10 point review of system is done which is negative except mentioned above/history of present illness.   PAST MEDICAL HISTORY :  Past Medical History  Diagnosis Date  . Diabetes mellitus without complication ([place])   . Hypertension   . GERD (gastroesophageal reflux disease)   . Anemia   . Insomnia   . Hyperlipidemia   . Cancer ([place])     skin  . Rectal cancer [facility]) 2009    Adenocarcinoma of the rectum, Tx N2 M0 (stage IIIC), diagnosed October 2009 - received neoadjuvant chemoradiation (5-FU infusion), then underwent AP resection on October 21, 2008 (and residual adenocarcinoma extending through muscularis propria, margins clear, 11 lymph nodes negative, discontinuous extramural extension was present but margins  clear).  . Breast cancer ([place]) 2009    T1N0 (sn) M0 Left breast invasive ductal carcinoma status post lumpectomy and sentinel node study June 08, 2008. tx w/ MammoSite. Tumor size 0.7 cm, margins clear.  2 sentinel nodes negative.  ER and PR positive.  HER-2/neu 2+, NEGATIVE on gene amplification.  Marland Kitchen History of chemotherapy   . History of radiation therapy   . History of chickenpox   . History of measles     PAST SURGICAL HISTORY :   Past Surgical History  Procedure Laterality Date  . Colonoscopy    . Rotator cuff repair  2000  . Breast surgery  2009    lumpectomy  . Colostomy  2010  . Dilation and curettage of uterus  1974  . Breast biopsy Left 2012    negative    FAMILY HISTORY :   Family History  Problem Relation Age of Onset  . Breast cancer Mother 11  . Colon cancer    . Leukemia    . Lung cancer    . Uterine cancer    . Skin cancer    . Prostate cancer    . Anemia    . Other      blood clots  . Hypertension    . Diabetes    . Heart disease      SOCIAL HISTORY:   Social History  Substance Use Topics  . Smoking status: Never Smoker   . Smokeless tobacco: Never Used  . Alcohol Use: No    ALLERGIES:  is allergic to amitriptyline; aspirin; chlorhexidine gluconate; ciprofloxacin; codeine;  lipitor; metoprolol; niacin and related; other; penicillins; propoxyphene; sulfa antibiotics; tramadol; and aloe vera.  MEDICATIONS:  Current Outpatient Prescriptions  Medication Sig Dispense Refill  . acetaminophen (RA ACETAMINOPHEN) 650 MG CR tablet Take 650 mg by mouth as needed.    . bisacodyl (STIMULANT LAXATIVE) 5 MG EC tablet Take 5 mg by mouth as needed.    . calcium elemental as carbonate (PX ANTACID MAXIMUM STRENGTH) 400 MG chewable tablet Chew by mouth.    . Cetirizine HCl 10 MG CAPS Take 10 mg by mouth daily.    . Cholecalciferol (VITAMIN D3) 1000 UNITS CAPS Take 1 capsule by mouth daily.    . Flaxseed, Linseed, (FLAXSEED OIL) 1000 MG CAPS Take 1,200 mg by  mouth daily.    Marland Kitchen levothyroxine (SYNTHROID, LEVOTHROID) 50 MCG tablet TK 1 T PO D  0  . lisinopril (PRINIVIL,ZESTRIL) 40 MG tablet Take 40 mg by mouth daily.    . magnesium oxide (MAG-OX) 400 MG tablet Take 400 mg by mouth daily.    . meclizine (ANTIVERT) 25 MG tablet Take 25 mg by mouth as needed.    . metFORMIN (GLUCOPHAGE-XR) 500 MG 24 hr tablet Take 1,500 mg by mouth daily.    . Multiple Vitamin (MULTI-VITAMINS) TABS Take by mouth daily.    Marland Kitchen omeprazole (PRILOSEC) 40 MG capsule     . ONETOUCH DELICA LANCETS 35K MISC     . pravastatin (PRAVACHOL) 20 MG tablet 40 mg.     . Simethicone 125 MG CAPS Take 125 mg by mouth as needed.    . triamterene-hydrochlorothiazide (MAXZIDE) 75-50 MG per tablet Take by mouth daily.    . triazolam (HALCION) 0.25 MG tablet Take 0.25 mg by mouth at bedtime as needed.    . vitamin B-12 (CYANOCOBALAMIN) 1000 MCG tablet Take 1,000 mcg by mouth daily.     No current facility-administered medications for this visit.    PHYSICAL EXAMINATION: ECOG PERFORMANCE STATUS: 0 - Asymptomatic  BP 124/73 mmHg  Pulse 78  Temp(Src) 98.4 F (36.9 C) (Oral)  Wt 196 lb 6.9 oz (89.1 kg)  SpO2 97%  Filed Weights   10/08/15 1107 10/08/15 1112  Weight: 196 lb 3.4 oz (89 kg) 196 lb 6.9 oz (89.1 kg)    GENERAL: Well-nourished well-developed; Alert, no distress and comfortable. Alone.  EYES: no pallor or icterus OROPHARYNX: no thrush or ulceration; good dentition  NECK: supple, no masses felt LYMPH:  no palpable lymphadenopathy in the cervical, axillary or inguinal regions LUNGS: clear to auscultation and  No wheeze or crackles HEART/CVS: regular rate & rhythm and no murmurs; No lower extremity edema; positive for colostomy.  ABDOMEN:abdomen soft, non-tender and normal bowel sounds Musculoskeletal:no cyanosis of digits and no clubbing  PSYCH: alert & oriented x 3 with fluent speech NEURO: no focal motor/sensory deficits SKIN:  no rashes or significant  lesions  LABORATORY DATA:  I have reviewed the data as listed    Component Value Date/Time   NA 134* 10/08/2015 1024   K 4.3 10/08/2015 1024   CL 102 10/08/2015 1024   CO2 23 10/08/2015 1024   GLUCOSE 165* 10/08/2015 1024   BUN 21* 10/08/2015 1024   CREATININE 1.06* 10/08/2015 1024   CREATININE 0.89 10/07/2014 1442   CALCIUM 9.1 10/08/2015 1024   CALCIUM 9.0 10/07/2014 1442   PROT 7.4 10/08/2015 1024   PROT 7.0 10/07/2014 1442   ALBUMIN 4.6 10/08/2015 1024   ALBUMIN 4.5 10/07/2014 1442   AST 24 10/08/2015 1024   AST 29  10/07/2014 1442   ALT 12* 10/08/2015 1024   ALT 18 10/07/2014 1442   ALKPHOS 36* 10/08/2015 1024   ALKPHOS 35* 10/07/2014 1442   BILITOT 0.5 10/08/2015 1024   BILITOT 0.5 10/07/2014 1442   GFRNONAA 53* 10/08/2015 1024   GFRNONAA >60 10/07/2014 1442   GFRNONAA 57* 04/15/2014 1150   GFRAA >60 10/08/2015 1024   GFRAA >60 10/07/2014 1442   GFRAA >60 04/15/2014 1150    No results found for: SPEP, UPEP  Lab Results  Component Value Date   WBC 6.4 10/08/2015   NEUTROABS 3.9 10/08/2015   HGB 11.6* 10/08/2015   HCT 34.2* 10/08/2015   MCV 87.8 10/08/2015   PLT 260 10/08/2015      Chemistry      Component Value Date/Time   NA 134* 10/08/2015 1024   K 4.3 10/08/2015 1024   CL 102 10/08/2015 1024   CO2 23 10/08/2015 1024   BUN 21* 10/08/2015 1024   CREATININE 1.06* 10/08/2015 1024   CREATININE 0.89 10/07/2014 1442      Component Value Date/Time   CALCIUM 9.1 10/08/2015 1024   CALCIUM 9.0 10/07/2014 1442   ALKPHOS 36* 10/08/2015 1024   ALKPHOS 35* 10/07/2014 1442   AST 24 10/08/2015 1024   AST 29 10/07/2014 1442   ALT 12* 10/08/2015 1024   ALT 18 10/07/2014 1442   BILITOT 0.5 10/08/2015 1024   BILITOT 0.5 10/07/2014 1442        ASSESSMENT & PLAN:   # RECTAL CANCER- Stage III  clinically no evidence of recurrence.  Patient declines follow-up colonoscopies. CEA last or normal. Today pending.   # BREAST CANCER- stage I ER/PR positive  HER-2/neu negative ; mammogram August 2016 with the normal limits.  Patient states to have a recent breast exam with her PCP.  Clinically no evidence of recurrence.   # ANEMIA- hemoglobin 11.6- unclear etiology. Recommend iron studies ferritin today.  # Osteoporosis- Previously received Reclast. Currently none. Discussed regarding repeating a bone density. She declines.  # If iron studies are abnormal/low- recommend stool occult testing; and a follow-up.   # Patient follow-up with me in one year with CBC CMP/CEA.    Cammie Sickle, MD 10/08/2015 11:16 AM

## 2015-10-08 NOTE — Telephone Encounter (Signed)
I spoke to the patient regarding the results of iron deficiency; concern for recurrent malignancy in the GI tract. Strongly recommend endoscopy. For now I recommend iron pills twice a day.  Patient wants to talk to her PCP / and then decide on endoscopy.    Heather - please fax a copy of the  H&P to pts PCP. Thx

## 2015-10-09 LAB — CEA: CEA: 1 ng/mL (ref 0.0–4.7)

## 2015-10-11 NOTE — Telephone Encounter (Signed)
Records (Dr. Aletha Halim phone note and office note and labs from 10/08/15) routed to PCP per md request.

## 2015-10-12 ENCOUNTER — Telehealth: Payer: Self-pay | Admitting: *Deleted

## 2015-10-12 DIAGNOSIS — D509 Iron deficiency anemia, unspecified: Secondary | ICD-10-CM

## 2015-10-12 NOTE — Telephone Encounter (Signed)
-----   Message from Cammie Sickle, MD sent at 10/08/2015  5:13 PM EDT ----- Please inform patient that- iron studies suggestive of iron deficiency; recommend stool occult 2; and strongly recommend repeat colonoscopy given history of rectal cancer/iron deficiency. Also recommend PO iron pills twice a day. Thx

## 2015-10-12 NOTE — Telephone Encounter (Signed)
Called patient and left message that her iron studies are suggestive of iron deficiency.  MD recommends patient come to [facility] and go to lab to obtain 2 stool cards to get quiac testing.  Also recommends she have a repeat colonoscopy given history of rectal cancer.  Also recommends PO iron twice a day.  Patient instructed to call back if she has questions.

## 2015-10-19 DIAGNOSIS — Z139 Encounter for screening, unspecified: Secondary | ICD-10-CM | POA: Diagnosis not present

## 2015-10-19 DIAGNOSIS — G4700 Insomnia, unspecified: Secondary | ICD-10-CM | POA: Diagnosis not present

## 2015-10-19 DIAGNOSIS — D509 Iron deficiency anemia, unspecified: Secondary | ICD-10-CM | POA: Diagnosis not present

## 2015-10-19 DIAGNOSIS — Z85048 Personal history of other malignant neoplasm of rectum, rectosigmoid junction, and anus: Secondary | ICD-10-CM | POA: Diagnosis not present

## 2015-11-01 DIAGNOSIS — C20 Malignant neoplasm of rectum: Secondary | ICD-10-CM | POA: Diagnosis not present

## 2015-11-01 DIAGNOSIS — Z933 Colostomy status: Secondary | ICD-10-CM | POA: Diagnosis not present

## 2015-11-15 ENCOUNTER — Other Ambulatory Visit: Payer: Self-pay | Admitting: Orthopedic Surgery

## 2015-11-15 DIAGNOSIS — M25551 Pain in right hip: Secondary | ICD-10-CM

## 2015-11-16 ENCOUNTER — Ambulatory Visit
Admission: RE | Admit: 2015-11-16 | Discharge: 2015-11-16 | Disposition: A | Discharge status: Home / Self Care | Payer: Medicare Other | Source: Ambulatory Visit | Attending: Orthopedic Surgery | Admitting: Orthopedic Surgery

## 2015-11-16 DIAGNOSIS — M25451 Effusion, right hip: Secondary | ICD-10-CM | POA: Diagnosis not present

## 2015-11-16 DIAGNOSIS — R937 Abnormal findings on diagnostic imaging of other parts of musculoskeletal system: Secondary | ICD-10-CM | POA: Diagnosis not present

## 2015-11-16 DIAGNOSIS — M25551 Pain in right hip: Secondary | ICD-10-CM | POA: Insufficient documentation

## 2015-11-16 DIAGNOSIS — M1611 Unilateral primary osteoarthritis, right hip: Secondary | ICD-10-CM | POA: Insufficient documentation

## 2015-11-22 DIAGNOSIS — M1611 Unilateral primary osteoarthritis, right hip: Secondary | ICD-10-CM | POA: Diagnosis not present

## 2015-12-01 ENCOUNTER — Encounter
Admission: RE | Admit: 2015-12-01 | Discharge: 2015-12-01 | Disposition: A | Discharge status: Home / Self Care | Payer: Medicare Other | Source: Ambulatory Visit | Attending: Orthopedic Surgery | Admitting: Orthopedic Surgery

## 2015-12-01 DIAGNOSIS — I10 Essential (primary) hypertension: Secondary | ICD-10-CM | POA: Diagnosis not present

## 2015-12-01 DIAGNOSIS — Z01812 Encounter for preprocedural laboratory examination: Secondary | ICD-10-CM | POA: Insufficient documentation

## 2015-12-01 DIAGNOSIS — Z01810 Encounter for preprocedural cardiovascular examination: Secondary | ICD-10-CM | POA: Insufficient documentation

## 2015-12-01 HISTORY — DX: Hypothyroidism, unspecified: E03.9

## 2015-12-01 HISTORY — DX: Unspecified osteoarthritis, unspecified site: M19.90

## 2015-12-01 HISTORY — DX: Restless legs syndrome: G25.81

## 2015-12-01 HISTORY — DX: Polyneuropathy, unspecified: G62.9

## 2015-12-01 HISTORY — DX: Dizziness and giddiness: R42

## 2015-12-01 LAB — URINALYSIS COMPLETE WITH MICROSCOPIC (ARMC ONLY)
Bacteria, UA: NONE SEEN
Bilirubin Urine: NEGATIVE
Glucose, UA: NEGATIVE mg/dL
Hgb urine dipstick: NEGATIVE
KETONES UR: NEGATIVE mg/dL
Leukocytes, UA: NEGATIVE
Nitrite: NEGATIVE
PH: 5 (ref 5.0–8.0)
PROTEIN: NEGATIVE mg/dL
RBC / HPF: NONE SEEN RBC/hpf (ref 0–5)
SQUAMOUS EPITHELIAL / LPF: NONE SEEN
Specific Gravity, Urine: 1.009 (ref 1.005–1.030)

## 2015-12-01 LAB — BASIC METABOLIC PANEL
ANION GAP: 11 (ref 5–15)
BUN: 19 mg/dL (ref 6–20)
CHLORIDE: 102 mmol/L (ref 101–111)
CO2: 23 mmol/L (ref 22–32)
Calcium: 9.4 mg/dL (ref 8.9–10.3)
Creatinine, Ser: 1.04 mg/dL — ABNORMAL HIGH (ref 0.44–1.00)
GFR calc non Af Amer: 54 mL/min — ABNORMAL LOW (ref >60)
Glucose, Bld: 161 mg/dL — ABNORMAL HIGH (ref 65–99)
POTASSIUM: 4 mmol/L (ref 3.5–5.1)
SODIUM: 136 mmol/L (ref 135–145)

## 2015-12-01 LAB — CBC
HCT: 34.8 % — ABNORMAL LOW (ref 35.0–47.0)
HEMOGLOBIN: 11.8 g/dL — AB (ref 12.0–16.0)
MCH: 29.8 pg (ref 26.0–34.0)
MCHC: 34 g/dL (ref 32.0–36.0)
MCV: 87.7 fL (ref 80.0–100.0)
Platelets: 289 10*3/uL (ref 150–440)
RBC: 3.97 MIL/uL (ref 3.80–5.20)
RDW: 14.4 % (ref 11.5–14.5)
WBC: 5.8 10*3/uL (ref 3.6–11.0)

## 2015-12-01 LAB — PROTIME-INR
INR: 1.07
PROTHROMBIN TIME: 14.1 s (ref 11.4–15.0)

## 2015-12-01 LAB — TYPE AND SCREEN
ABO/RH(D): A POS
ANTIBODY SCREEN: NEGATIVE

## 2015-12-01 LAB — SURGICAL PCR SCREEN
MRSA, PCR: NEGATIVE
STAPHYLOCOCCUS AUREUS: POSITIVE — AB

## 2015-12-01 LAB — SEDIMENTATION RATE: Sed Rate: 9 mm/hr (ref 0–30)

## 2015-12-01 LAB — ABO/RH: ABO/RH(D): A POS

## 2015-12-01 LAB — APTT: APTT: 28 s (ref 24–36)

## 2015-12-01 NOTE — Pre-Procedure Instructions (Signed)
MEDICAL REQUEST FOR CLEARANCE /EKG AS INSTRUCTED BY DR [place] FAXED TO DR Jerilynn Mages [facility],SPOKE WITH ELLEN. ALSO CALLED AND FAXED TO TIFFANY AT DR [facility].LM FOR PATIENT

## 2015-12-01 NOTE — Patient Instructions (Addendum)
  Your procedure is scheduled on: December 07, 2015 (Tuesday) Report to Same Day Surgery 2nd floor [facility] To find out your arrival time please call [phone] between 1PM - 3PM on December 06, 2015 (Monday)  Remember: Instructions that are not followed completely may result in serious medical risk, up to and including death, or upon the discretion of your surgeon and anesthesiologist your surgery may need to be rescheduled.    _x___ 1. Do not eat food or drink liquids after midnight. No gum chewing or hard candies.     ___ 2. No Alcohol for 24 hours before or after surgery.   ____ 3. Bring all medications with you on the day of surgery if instructed.    __x__ 4. Notify your doctor if there is any change in your medical condition     (cold, fever, infections).     Do not wear jewelry, make-up, hairpins, clips or nail polish.  Do not wear lotions, powders, or perfumes. You may wear deodorant.  Do not shave 48 hours prior to surgery. Men may shave face and neck.  Do not bring valuables to the hospital.    [facility] is not responsible for any belongings or valuables.               Contacts, dentures or bridgework may not be worn into surgery.  Leave your suitcase in the car. After surgery it may be brought to your room.  For patients admitted to the hospital, discharge time is determined by your treatment team.   Patients discharged the day of surgery will not be allowed to drive home.    Please read over the following fact sheets that you were given:   [facility] Preparing for Surgery and or MRSA Information   _x___ Take these medicines the morning of surgery with A SIP OF WATER:    1. Omeprazole(Omeprazole at bedtime on Monday night)  2.Lisinopril  3.Magnesium  4.  5.  6.  ____ Fleet Enema (as directed)   ___ Use CHG Soap or sage wipes as directed on instruction sheet (PATIENT ALLERGY)   ____ Use inhalers on the day of surgery and bring to hospital day of surgery  _x___  Stop metformin 2 days prior to surgery(STOP METFORMIN ON June 4)    ____ Take 1/2 of usual insulin dose the night before surgery and none on the morning of surgey        .   __x__ Stop aspirin or coumadin, or plavix (N/A)   _x__ Stop Anti-inflammatories such as Advil, Aleve, Ibuprofen, Motrin, Naproxen,          Naprosyn, Goodies powders or aspirin products. Ok to take Tylenol.   _x___ Stop supplements until after surgery.  (STOP VITAMIN B-12, AND FLAXSEED NOW)  ____ Bring C-Pap to the hospital.

## 2015-12-02 DIAGNOSIS — Z933 Colostomy status: Secondary | ICD-10-CM | POA: Diagnosis not present

## 2015-12-02 DIAGNOSIS — C20 Malignant neoplasm of rectum: Secondary | ICD-10-CM | POA: Diagnosis not present

## 2015-12-02 LAB — URINE CULTURE

## 2015-12-02 NOTE — Pre-Procedure Instructions (Signed)
Urine culture results sent to Dr. Menz for review. 

## 2015-12-02 NOTE — Pre-Procedure Instructions (Signed)
PATIENT CALLED AND MAY CNL SURGERY. STATES FEELS RUSHED AND WAITING ON CALL BACK FROM DR [facility] OFFICE RE CLEARANCE. STATES SHE WILL CONTACT THEIR OFFICE.

## 2015-12-07 ENCOUNTER — Inpatient Hospital Stay: Admission: RE | Admit: 2015-12-07 | Payer: Medicare Other | Source: Ambulatory Visit | Admitting: Orthopedic Surgery

## 2015-12-07 ENCOUNTER — Encounter: Admission: RE | Payer: Self-pay | Source: Ambulatory Visit

## 2015-12-07 SURGERY — ARTHROPLASTY, HIP, TOTAL, ANTERIOR APPROACH
Anesthesia: Choice | Laterality: Right

## 2015-12-08 DIAGNOSIS — G4700 Insomnia, unspecified: Secondary | ICD-10-CM | POA: Diagnosis not present

## 2015-12-08 DIAGNOSIS — I10 Essential (primary) hypertension: Secondary | ICD-10-CM | POA: Diagnosis not present

## 2015-12-08 DIAGNOSIS — E039 Hypothyroidism, unspecified: Secondary | ICD-10-CM | POA: Diagnosis not present

## 2015-12-08 DIAGNOSIS — E782 Mixed hyperlipidemia: Secondary | ICD-10-CM | POA: Diagnosis not present

## 2015-12-08 DIAGNOSIS — E119 Type 2 diabetes mellitus without complications: Secondary | ICD-10-CM | POA: Diagnosis not present

## 2016-01-07 DIAGNOSIS — Z933 Colostomy status: Secondary | ICD-10-CM | POA: Diagnosis not present

## 2016-01-07 DIAGNOSIS — C20 Malignant neoplasm of rectum: Secondary | ICD-10-CM | POA: Diagnosis not present

## 2016-02-08 DIAGNOSIS — C20 Malignant neoplasm of rectum: Secondary | ICD-10-CM | POA: Diagnosis not present

## 2016-02-08 DIAGNOSIS — Z933 Colostomy status: Secondary | ICD-10-CM | POA: Diagnosis not present

## 2016-03-02 ENCOUNTER — Ambulatory Visit
Admission: RE | Admit: 2016-03-02 | Discharge: 2016-03-02 | Disposition: A | Discharge status: Home / Self Care | Payer: Medicare Other | Source: Ambulatory Visit | Attending: Internal Medicine | Admitting: Internal Medicine

## 2016-03-02 ENCOUNTER — Other Ambulatory Visit: Payer: Self-pay | Admitting: Internal Medicine

## 2016-03-02 DIAGNOSIS — C50919 Malignant neoplasm of unspecified site of unspecified female breast: Secondary | ICD-10-CM

## 2016-03-02 DIAGNOSIS — C20 Malignant neoplasm of rectum: Secondary | ICD-10-CM

## 2016-03-02 DIAGNOSIS — Z1231 Encounter for screening mammogram for malignant neoplasm of breast: Secondary | ICD-10-CM | POA: Diagnosis not present

## 2016-03-16 DIAGNOSIS — C20 Malignant neoplasm of rectum: Secondary | ICD-10-CM | POA: Diagnosis not present

## 2016-03-16 DIAGNOSIS — Z933 Colostomy status: Secondary | ICD-10-CM | POA: Diagnosis not present

## 2016-03-17 DIAGNOSIS — Z933 Colostomy status: Secondary | ICD-10-CM | POA: Diagnosis not present

## 2016-04-11 DIAGNOSIS — Z7984 Long term (current) use of oral hypoglycemic drugs: Secondary | ICD-10-CM | POA: Diagnosis not present

## 2016-04-11 DIAGNOSIS — E119 Type 2 diabetes mellitus without complications: Secondary | ICD-10-CM | POA: Diagnosis not present

## 2016-04-11 DIAGNOSIS — H2513 Age-related nuclear cataract, bilateral: Secondary | ICD-10-CM | POA: Diagnosis not present

## 2016-04-13 DIAGNOSIS — Z933 Colostomy status: Secondary | ICD-10-CM | POA: Diagnosis not present

## 2016-04-13 DIAGNOSIS — C20 Malignant neoplasm of rectum: Secondary | ICD-10-CM | POA: Diagnosis not present

## 2016-04-21 DIAGNOSIS — E039 Hypothyroidism, unspecified: Secondary | ICD-10-CM | POA: Diagnosis not present

## 2016-04-21 DIAGNOSIS — Z1389 Encounter for screening for other disorder: Secondary | ICD-10-CM | POA: Diagnosis not present

## 2016-04-21 DIAGNOSIS — Z9181 History of falling: Secondary | ICD-10-CM | POA: Diagnosis not present

## 2016-04-21 DIAGNOSIS — I10 Essential (primary) hypertension: Secondary | ICD-10-CM | POA: Diagnosis not present

## 2016-04-21 DIAGNOSIS — E119 Type 2 diabetes mellitus without complications: Secondary | ICD-10-CM | POA: Diagnosis not present

## 2016-08-16 DIAGNOSIS — C20 Malignant neoplasm of rectum: Secondary | ICD-10-CM | POA: Diagnosis not present

## 2016-08-16 DIAGNOSIS — Z933 Colostomy status: Secondary | ICD-10-CM | POA: Diagnosis not present

## 2016-08-21 DIAGNOSIS — Z85038 Personal history of other malignant neoplasm of large intestine: Secondary | ICD-10-CM | POA: Diagnosis not present

## 2016-08-21 DIAGNOSIS — E119 Type 2 diabetes mellitus without complications: Secondary | ICD-10-CM | POA: Diagnosis not present

## 2016-08-21 DIAGNOSIS — E039 Hypothyroidism, unspecified: Secondary | ICD-10-CM | POA: Diagnosis not present

## 2016-08-21 DIAGNOSIS — E782 Mixed hyperlipidemia: Secondary | ICD-10-CM | POA: Diagnosis not present

## 2016-08-21 DIAGNOSIS — D509 Iron deficiency anemia, unspecified: Secondary | ICD-10-CM | POA: Diagnosis not present

## 2016-08-22 DIAGNOSIS — E119 Type 2 diabetes mellitus without complications: Secondary | ICD-10-CM | POA: Diagnosis not present

## 2016-08-22 DIAGNOSIS — E782 Mixed hyperlipidemia: Secondary | ICD-10-CM | POA: Diagnosis not present

## 2016-08-22 DIAGNOSIS — E039 Hypothyroidism, unspecified: Secondary | ICD-10-CM | POA: Diagnosis not present

## 2016-08-22 DIAGNOSIS — I10 Essential (primary) hypertension: Secondary | ICD-10-CM | POA: Diagnosis not present

## 2016-09-13 DIAGNOSIS — Z933 Colostomy status: Secondary | ICD-10-CM | POA: Diagnosis not present

## 2016-09-13 DIAGNOSIS — C20 Malignant neoplasm of rectum: Secondary | ICD-10-CM | POA: Diagnosis not present

## 2016-10-09 ENCOUNTER — Other Ambulatory Visit: Payer: Medicare Other

## 2016-10-09 ENCOUNTER — Ambulatory Visit: Payer: Medicare Other | Admitting: Internal Medicine

## 2016-10-19 DIAGNOSIS — Z933 Colostomy status: Secondary | ICD-10-CM | POA: Diagnosis not present

## 2016-10-19 DIAGNOSIS — C20 Malignant neoplasm of rectum: Secondary | ICD-10-CM | POA: Diagnosis not present

## 2016-11-20 DIAGNOSIS — Z933 Colostomy status: Secondary | ICD-10-CM | POA: Diagnosis not present

## 2016-11-20 DIAGNOSIS — C20 Malignant neoplasm of rectum: Secondary | ICD-10-CM | POA: Diagnosis not present

## 2016-12-21 DIAGNOSIS — C20 Malignant neoplasm of rectum: Secondary | ICD-10-CM | POA: Diagnosis not present

## 2016-12-21 DIAGNOSIS — Z933 Colostomy status: Secondary | ICD-10-CM | POA: Diagnosis not present

## 2016-12-26 DIAGNOSIS — E039 Hypothyroidism, unspecified: Secondary | ICD-10-CM | POA: Diagnosis not present

## 2016-12-26 DIAGNOSIS — E782 Mixed hyperlipidemia: Secondary | ICD-10-CM | POA: Diagnosis not present

## 2016-12-26 DIAGNOSIS — Z85038 Personal history of other malignant neoplasm of large intestine: Secondary | ICD-10-CM | POA: Diagnosis not present

## 2016-12-26 DIAGNOSIS — E119 Type 2 diabetes mellitus without complications: Secondary | ICD-10-CM | POA: Diagnosis not present

## 2016-12-26 DIAGNOSIS — I10 Essential (primary) hypertension: Secondary | ICD-10-CM | POA: Diagnosis not present

## 2016-12-28 DIAGNOSIS — I10 Essential (primary) hypertension: Secondary | ICD-10-CM | POA: Diagnosis not present

## 2016-12-28 DIAGNOSIS — Z139 Encounter for screening, unspecified: Secondary | ICD-10-CM | POA: Diagnosis not present

## 2016-12-28 DIAGNOSIS — E039 Hypothyroidism, unspecified: Secondary | ICD-10-CM | POA: Diagnosis not present

## 2016-12-28 DIAGNOSIS — E782 Mixed hyperlipidemia: Secondary | ICD-10-CM | POA: Diagnosis not present

## 2017-01-05 DIAGNOSIS — L819 Disorder of pigmentation, unspecified: Secondary | ICD-10-CM | POA: Diagnosis not present

## 2017-01-05 DIAGNOSIS — C44619 Basal cell carcinoma of skin of left upper limb, including shoulder: Secondary | ICD-10-CM | POA: Diagnosis not present

## 2017-01-15 DIAGNOSIS — C20 Malignant neoplasm of rectum: Secondary | ICD-10-CM | POA: Diagnosis not present

## 2017-01-15 DIAGNOSIS — Z933 Colostomy status: Secondary | ICD-10-CM | POA: Diagnosis not present

## 2017-01-17 ENCOUNTER — Other Ambulatory Visit: Payer: Self-pay | Admitting: Nurse Practitioner

## 2017-01-17 DIAGNOSIS — Z1231 Encounter for screening mammogram for malignant neoplasm of breast: Secondary | ICD-10-CM

## 2017-02-13 DIAGNOSIS — Z933 Colostomy status: Secondary | ICD-10-CM | POA: Diagnosis not present

## 2017-02-13 DIAGNOSIS — C20 Malignant neoplasm of rectum: Secondary | ICD-10-CM | POA: Diagnosis not present

## 2017-03-13 ENCOUNTER — Ambulatory Visit
Admission: RE | Admit: 2017-03-13 | Discharge: 2017-03-13 | Disposition: A | Discharge status: Home / Self Care | Payer: Medicare Other | Source: Ambulatory Visit | Attending: Nurse Practitioner | Admitting: Nurse Practitioner

## 2017-03-13 DIAGNOSIS — Z1231 Encounter for screening mammogram for malignant neoplasm of breast: Secondary | ICD-10-CM | POA: Insufficient documentation

## 2017-03-22 DIAGNOSIS — C20 Malignant neoplasm of rectum: Secondary | ICD-10-CM | POA: Diagnosis not present

## 2017-03-22 DIAGNOSIS — Z933 Colostomy status: Secondary | ICD-10-CM | POA: Diagnosis not present

## 2017-04-11 DIAGNOSIS — H40003 Preglaucoma, unspecified, bilateral: Secondary | ICD-10-CM | POA: Diagnosis not present

## 2017-04-11 DIAGNOSIS — H2510 Age-related nuclear cataract, unspecified eye: Secondary | ICD-10-CM | POA: Diagnosis not present

## 2017-04-11 DIAGNOSIS — Z7984 Long term (current) use of oral hypoglycemic drugs: Secondary | ICD-10-CM | POA: Diagnosis not present

## 2017-04-11 DIAGNOSIS — E119 Type 2 diabetes mellitus without complications: Secondary | ICD-10-CM | POA: Diagnosis not present

## 2017-04-27 DIAGNOSIS — E782 Mixed hyperlipidemia: Secondary | ICD-10-CM | POA: Diagnosis not present

## 2017-04-27 DIAGNOSIS — E039 Hypothyroidism, unspecified: Secondary | ICD-10-CM | POA: Diagnosis not present

## 2017-04-27 DIAGNOSIS — I10 Essential (primary) hypertension: Secondary | ICD-10-CM | POA: Diagnosis not present

## 2017-04-27 DIAGNOSIS — D509 Iron deficiency anemia, unspecified: Secondary | ICD-10-CM | POA: Diagnosis not present

## 2017-05-01 DIAGNOSIS — E782 Mixed hyperlipidemia: Secondary | ICD-10-CM | POA: Diagnosis not present

## 2017-05-01 DIAGNOSIS — Z1331 Encounter for screening for depression: Secondary | ICD-10-CM | POA: Diagnosis not present

## 2017-05-01 DIAGNOSIS — Z85038 Personal history of other malignant neoplasm of large intestine: Secondary | ICD-10-CM | POA: Diagnosis not present

## 2017-05-01 DIAGNOSIS — Z9181 History of falling: Secondary | ICD-10-CM | POA: Diagnosis not present

## 2017-05-01 DIAGNOSIS — E119 Type 2 diabetes mellitus without complications: Secondary | ICD-10-CM | POA: Diagnosis not present

## 2017-05-01 DIAGNOSIS — Z933 Colostomy status: Secondary | ICD-10-CM | POA: Diagnosis not present

## 2017-05-01 DIAGNOSIS — I10 Essential (primary) hypertension: Secondary | ICD-10-CM | POA: Diagnosis not present

## 2017-05-09 DIAGNOSIS — Z933 Colostomy status: Secondary | ICD-10-CM | POA: Diagnosis not present

## 2017-05-09 DIAGNOSIS — C20 Malignant neoplasm of rectum: Secondary | ICD-10-CM | POA: Diagnosis not present

## 2017-06-14 DIAGNOSIS — C20 Malignant neoplasm of rectum: Secondary | ICD-10-CM | POA: Diagnosis not present

## 2017-06-14 DIAGNOSIS — Z933 Colostomy status: Secondary | ICD-10-CM | POA: Diagnosis not present

## 2017-07-17 DIAGNOSIS — Z933 Colostomy status: Secondary | ICD-10-CM | POA: Diagnosis not present

## 2017-07-17 DIAGNOSIS — C20 Malignant neoplasm of rectum: Secondary | ICD-10-CM | POA: Diagnosis not present

## 2017-08-16 DIAGNOSIS — Z933 Colostomy status: Secondary | ICD-10-CM | POA: Diagnosis not present

## 2017-08-16 DIAGNOSIS — C20 Malignant neoplasm of rectum: Secondary | ICD-10-CM | POA: Diagnosis not present

## 2017-08-30 DIAGNOSIS — E039 Hypothyroidism, unspecified: Secondary | ICD-10-CM | POA: Diagnosis not present

## 2017-08-30 DIAGNOSIS — E782 Mixed hyperlipidemia: Secondary | ICD-10-CM | POA: Diagnosis not present

## 2017-08-30 DIAGNOSIS — D509 Iron deficiency anemia, unspecified: Secondary | ICD-10-CM | POA: Diagnosis not present

## 2017-08-30 DIAGNOSIS — E119 Type 2 diabetes mellitus without complications: Secondary | ICD-10-CM | POA: Diagnosis not present

## 2017-08-30 DIAGNOSIS — Z85038 Personal history of other malignant neoplasm of large intestine: Secondary | ICD-10-CM | POA: Diagnosis not present

## 2017-09-04 DIAGNOSIS — E782 Mixed hyperlipidemia: Secondary | ICD-10-CM | POA: Diagnosis not present

## 2017-09-04 DIAGNOSIS — E039 Hypothyroidism, unspecified: Secondary | ICD-10-CM | POA: Diagnosis not present

## 2017-09-04 DIAGNOSIS — Z139 Encounter for screening, unspecified: Secondary | ICD-10-CM | POA: Diagnosis not present

## 2017-09-04 DIAGNOSIS — E119 Type 2 diabetes mellitus without complications: Secondary | ICD-10-CM | POA: Diagnosis not present

## 2017-09-04 DIAGNOSIS — D509 Iron deficiency anemia, unspecified: Secondary | ICD-10-CM | POA: Diagnosis not present

## 2017-09-14 DIAGNOSIS — C20 Malignant neoplasm of rectum: Secondary | ICD-10-CM | POA: Diagnosis not present

## 2017-09-14 DIAGNOSIS — Z933 Colostomy status: Secondary | ICD-10-CM | POA: Diagnosis not present

## 2017-10-12 DIAGNOSIS — Z933 Colostomy status: Secondary | ICD-10-CM | POA: Diagnosis not present

## 2017-10-12 DIAGNOSIS — C20 Malignant neoplasm of rectum: Secondary | ICD-10-CM | POA: Diagnosis not present

## 2017-11-06 DIAGNOSIS — Z933 Colostomy status: Secondary | ICD-10-CM | POA: Diagnosis not present

## 2017-11-06 DIAGNOSIS — C20 Malignant neoplasm of rectum: Secondary | ICD-10-CM | POA: Diagnosis not present

## 2017-12-13 DIAGNOSIS — C20 Malignant neoplasm of rectum: Secondary | ICD-10-CM | POA: Diagnosis not present

## 2017-12-13 DIAGNOSIS — Z933 Colostomy status: Secondary | ICD-10-CM | POA: Diagnosis not present

## 2017-12-31 DIAGNOSIS — E039 Hypothyroidism, unspecified: Secondary | ICD-10-CM | POA: Diagnosis not present

## 2017-12-31 DIAGNOSIS — D509 Iron deficiency anemia, unspecified: Secondary | ICD-10-CM | POA: Diagnosis not present

## 2017-12-31 DIAGNOSIS — Z85038 Personal history of other malignant neoplasm of large intestine: Secondary | ICD-10-CM | POA: Diagnosis not present

## 2017-12-31 DIAGNOSIS — E119 Type 2 diabetes mellitus without complications: Secondary | ICD-10-CM | POA: Diagnosis not present

## 2017-12-31 DIAGNOSIS — E782 Mixed hyperlipidemia: Secondary | ICD-10-CM | POA: Diagnosis not present

## 2018-01-04 DIAGNOSIS — Z79899 Other long term (current) drug therapy: Secondary | ICD-10-CM | POA: Diagnosis not present

## 2018-01-04 DIAGNOSIS — E782 Mixed hyperlipidemia: Secondary | ICD-10-CM | POA: Diagnosis not present

## 2018-01-04 DIAGNOSIS — E119 Type 2 diabetes mellitus without complications: Secondary | ICD-10-CM | POA: Diagnosis not present

## 2018-01-04 DIAGNOSIS — Z1339 Encounter for screening examination for other mental health and behavioral disorders: Secondary | ICD-10-CM | POA: Diagnosis not present

## 2018-01-04 DIAGNOSIS — E039 Hypothyroidism, unspecified: Secondary | ICD-10-CM | POA: Diagnosis not present

## 2018-01-11 DIAGNOSIS — C20 Malignant neoplasm of rectum: Secondary | ICD-10-CM | POA: Diagnosis not present

## 2018-01-11 DIAGNOSIS — Z933 Colostomy status: Secondary | ICD-10-CM | POA: Diagnosis not present

## 2018-01-30 DIAGNOSIS — C20 Malignant neoplasm of rectum: Secondary | ICD-10-CM | POA: Diagnosis not present

## 2018-01-30 DIAGNOSIS — Z933 Colostomy status: Secondary | ICD-10-CM | POA: Diagnosis not present

## 2018-02-13 ENCOUNTER — Other Ambulatory Visit: Payer: Self-pay | Admitting: Nurse Practitioner

## 2018-02-13 DIAGNOSIS — Z1231 Encounter for screening mammogram for malignant neoplasm of breast: Secondary | ICD-10-CM

## 2018-02-14 DIAGNOSIS — C20 Malignant neoplasm of rectum: Secondary | ICD-10-CM | POA: Diagnosis not present

## 2018-02-14 DIAGNOSIS — Z933 Colostomy status: Secondary | ICD-10-CM | POA: Diagnosis not present

## 2018-03-05 DIAGNOSIS — Z933 Colostomy status: Secondary | ICD-10-CM | POA: Diagnosis not present

## 2018-03-05 DIAGNOSIS — C20 Malignant neoplasm of rectum: Secondary | ICD-10-CM | POA: Diagnosis not present

## 2018-03-18 ENCOUNTER — Ambulatory Visit
Admission: RE | Admit: 2018-03-18 | Discharge: 2018-03-18 | Disposition: A | Discharge status: Home / Self Care | Payer: Medicare Other | Source: Ambulatory Visit | Attending: Nurse Practitioner | Admitting: Nurse Practitioner

## 2018-03-18 DIAGNOSIS — Z1231 Encounter for screening mammogram for malignant neoplasm of breast: Secondary | ICD-10-CM | POA: Diagnosis not present

## 2018-03-19 DIAGNOSIS — Z933 Colostomy status: Secondary | ICD-10-CM | POA: Diagnosis not present

## 2018-03-19 DIAGNOSIS — C20 Malignant neoplasm of rectum: Secondary | ICD-10-CM | POA: Diagnosis not present

## 2018-04-30 ENCOUNTER — Other Ambulatory Visit: Payer: Self-pay

## 2018-04-30 NOTE — Patient Outreach (Signed)
[facility]) Care Management  04/30/2018  [name] Jan 26, 1946 [id number]   Medication Adherence call to [name] left a message for patient to call back patient is due on Pravastatin 40 mg, Lisinopril 40 mg and Metformin Er 500 mg under [place].   [place] Management Direct Dial [phone]  Fax [phone] [name]

## 2018-07-19 IMAGING — MG MM DIGITAL SCREENING BILAT W/ TOMO W/ CAD
8 of 13 series · 8 of 29 positions shown · non-contrast
Comparison: Previous exam(s).

CLINICAL DATA: Screening.

EXAM:
2D DIGITAL SCREENING BILATERAL MAMMOGRAM WITH CAD AND ADJUNCT TOMO

[R MLO (1 of 2)]
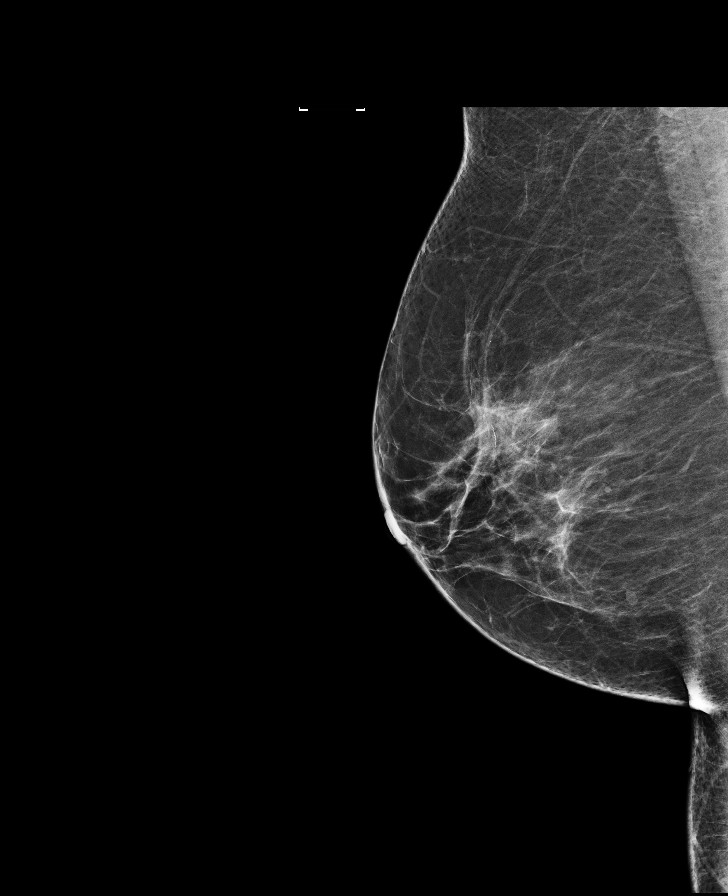

[L MLO]
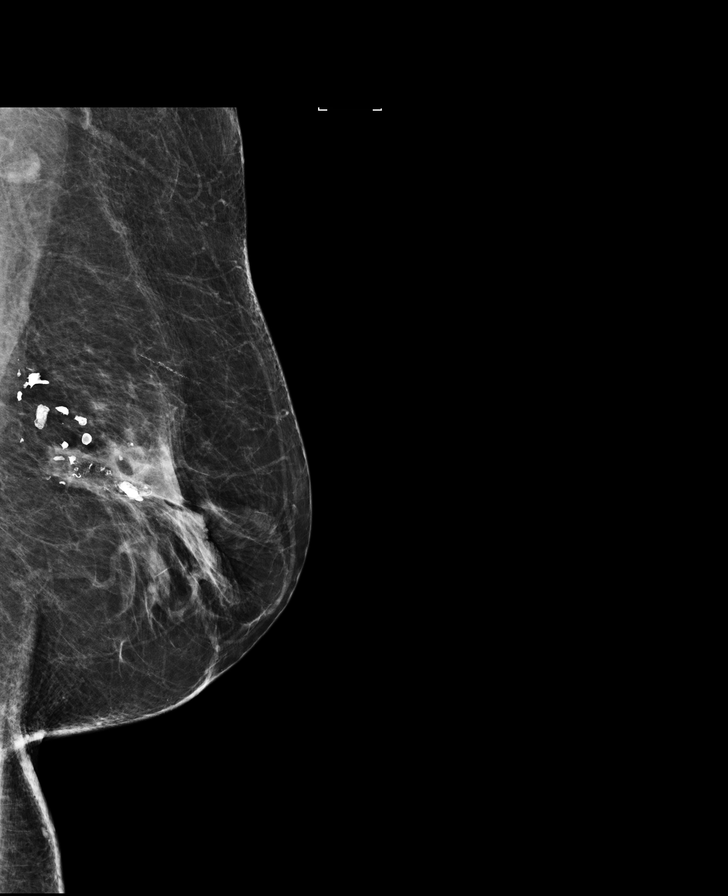

[R CC]
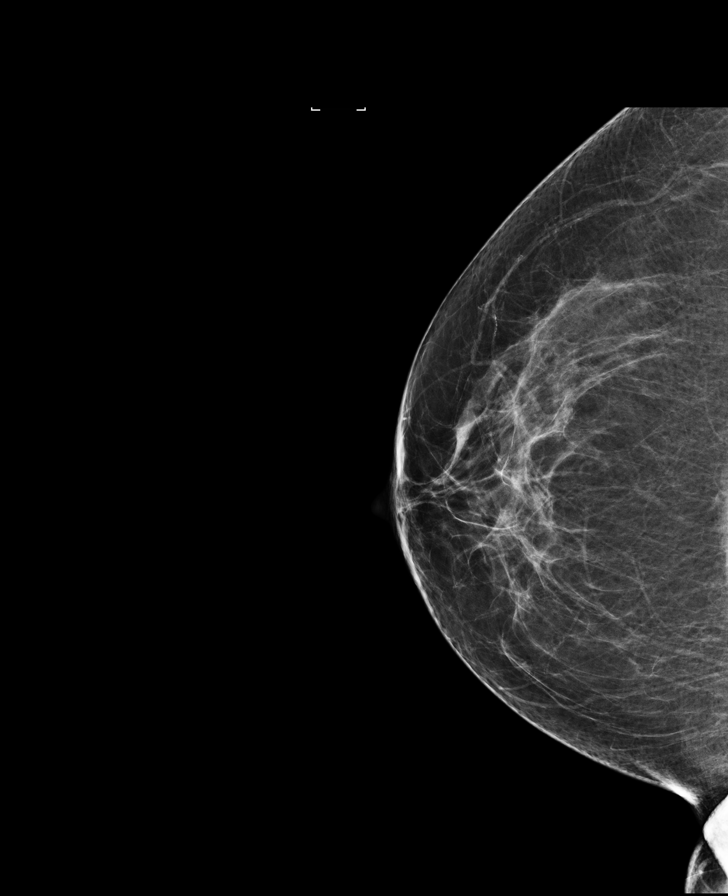

[R MLO (2 of 2)]
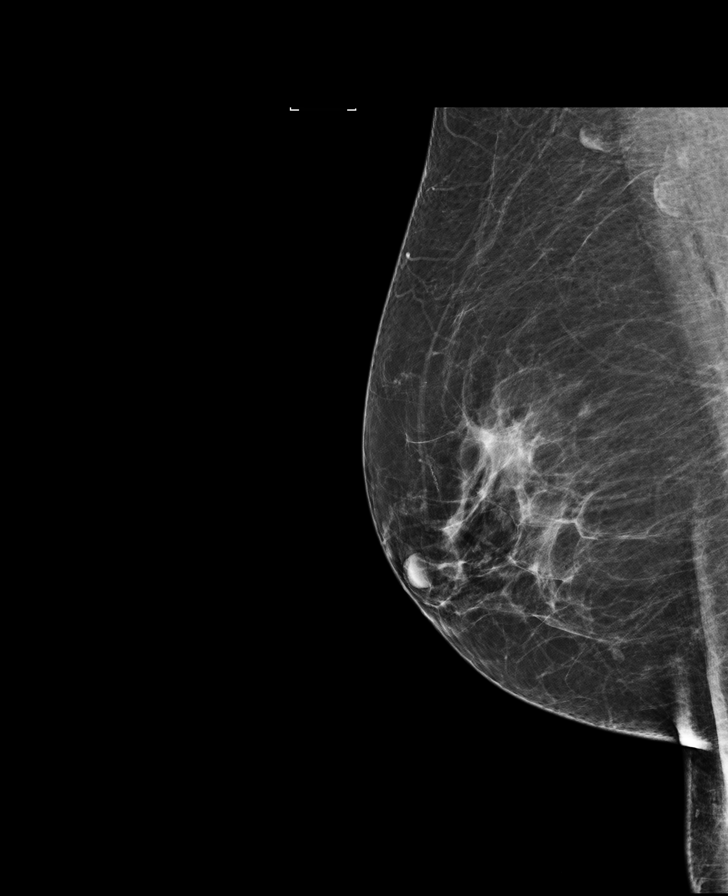

[L CC synth-2D]
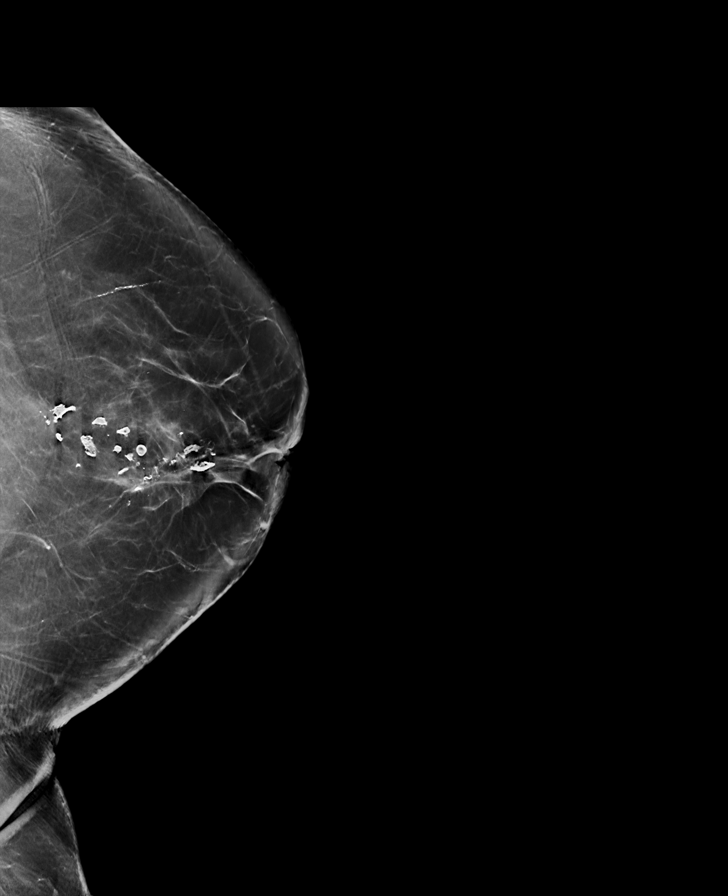

[R CC synth-2D]
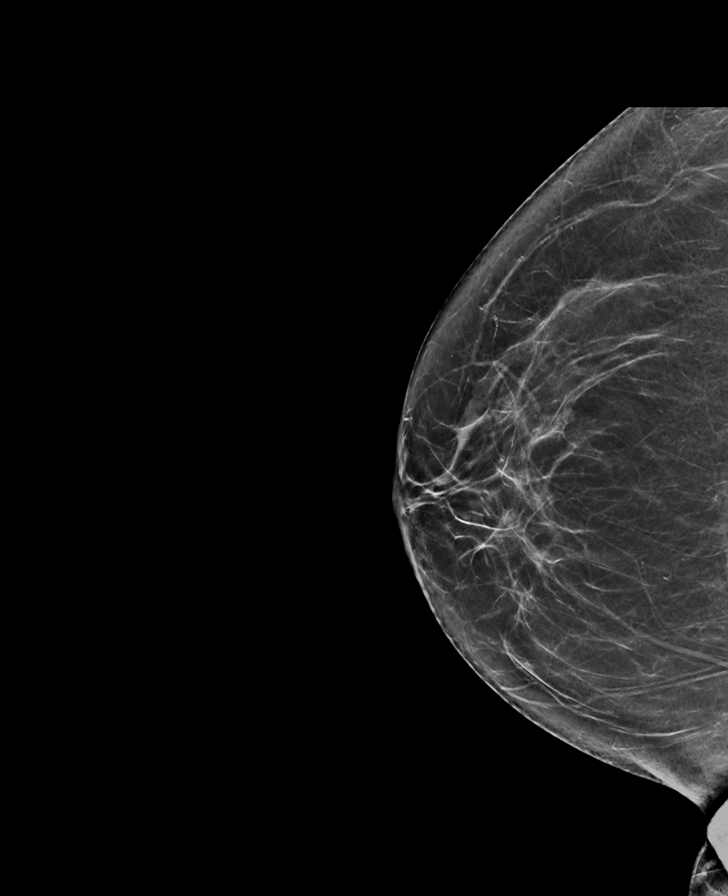

[L CC]
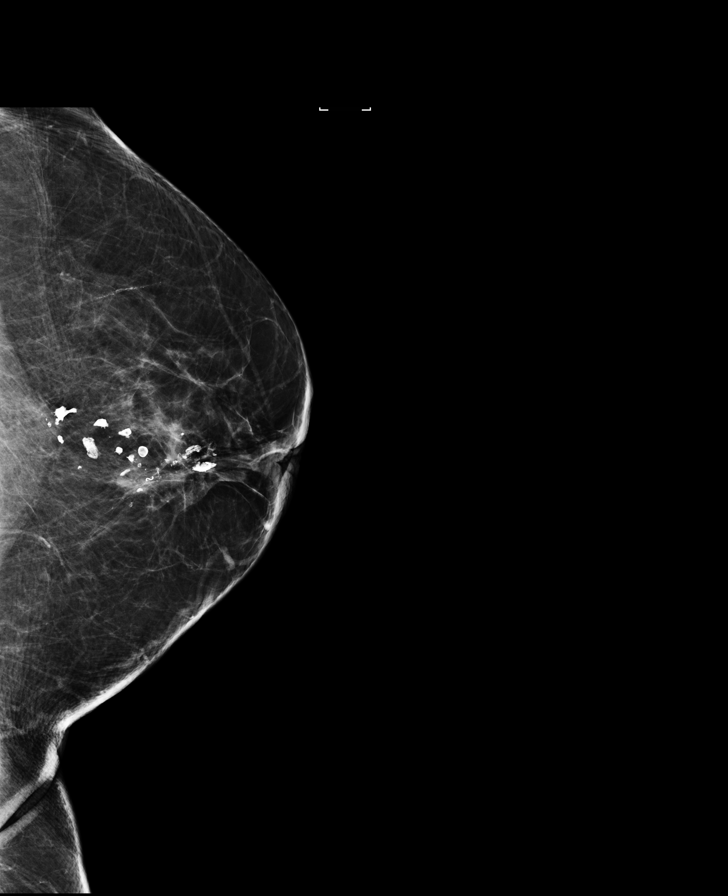

[L MLO synth-2D]
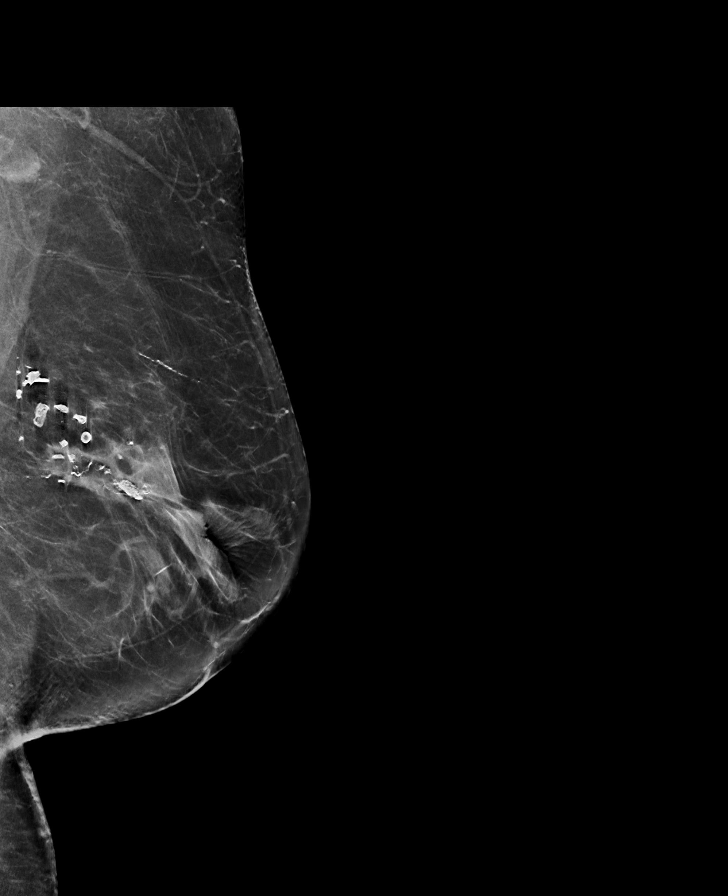

[8 of 29 positions shown; findings below may reference images not displayed]

ACR Breast Density Category b: There are scattered areas of
fibroglandular density.
FINDINGS: There are no findings suspicious for malignancy. Images were
processed with CAD.
IMPRESSION: No mammographic evidence of malignancy. A result letter of this
screening mammogram will be mailed directly to the patient.

RECOMMENDATION:
Screening mammogram in one year. (Code:97-6-RS4)

BI-RADS CATEGORY  1: Negative.

## 2019-03-18 ENCOUNTER — Other Ambulatory Visit: Payer: Self-pay | Admitting: Nurse Practitioner

## 2019-09-03 DIAGNOSIS — E119 Type 2 diabetes mellitus without complications: Secondary | ICD-10-CM | POA: Diagnosis not present

## 2019-09-03 DIAGNOSIS — Z9181 History of falling: Secondary | ICD-10-CM | POA: Diagnosis not present

## 2019-09-03 DIAGNOSIS — E782 Mixed hyperlipidemia: Secondary | ICD-10-CM | POA: Diagnosis not present

## 2019-09-03 DIAGNOSIS — Z79899 Other long term (current) drug therapy: Secondary | ICD-10-CM | POA: Diagnosis not present

## 2019-09-03 DIAGNOSIS — I10 Essential (primary) hypertension: Secondary | ICD-10-CM | POA: Diagnosis not present

## 2019-09-03 DIAGNOSIS — E039 Hypothyroidism, unspecified: Secondary | ICD-10-CM | POA: Diagnosis not present

## 2019-09-19 DIAGNOSIS — Z933 Colostomy status: Secondary | ICD-10-CM | POA: Diagnosis not present

## 2019-09-19 DIAGNOSIS — C20 Malignant neoplasm of rectum: Secondary | ICD-10-CM | POA: Diagnosis not present

## 2019-10-14 DIAGNOSIS — Z7984 Long term (current) use of oral hypoglycemic drugs: Secondary | ICD-10-CM | POA: Diagnosis not present

## 2019-10-14 DIAGNOSIS — H40003 Preglaucoma, unspecified, bilateral: Secondary | ICD-10-CM | POA: Diagnosis not present

## 2019-10-14 DIAGNOSIS — E119 Type 2 diabetes mellitus without complications: Secondary | ICD-10-CM | POA: Diagnosis not present

## 2019-10-14 DIAGNOSIS — H2513 Age-related nuclear cataract, bilateral: Secondary | ICD-10-CM | POA: Diagnosis not present

## 2019-10-15 ENCOUNTER — Other Ambulatory Visit: Payer: Self-pay | Admitting: Nurse Practitioner

## 2019-10-15 DIAGNOSIS — Z1231 Encounter for screening mammogram for malignant neoplasm of breast: Secondary | ICD-10-CM

## 2019-10-21 ENCOUNTER — Ambulatory Visit
Admission: RE | Admit: 2019-10-21 | Discharge: 2019-10-21 | Disposition: A | Discharge status: Home / Self Care | Payer: Medicare Other | Source: Ambulatory Visit | Attending: Nurse Practitioner | Admitting: Nurse Practitioner

## 2019-10-21 DIAGNOSIS — Z1231 Encounter for screening mammogram for malignant neoplasm of breast: Secondary | ICD-10-CM | POA: Insufficient documentation

## 2019-10-21 HISTORY — DX: Personal history of irradiation: Z92.3

## 2019-10-21 HISTORY — DX: Personal history of antineoplastic chemotherapy: Z92.21

## 2019-10-22 DIAGNOSIS — C20 Malignant neoplasm of rectum: Secondary | ICD-10-CM | POA: Diagnosis not present

## 2019-10-22 DIAGNOSIS — Z933 Colostomy status: Secondary | ICD-10-CM | POA: Diagnosis not present

## 2019-11-11 DIAGNOSIS — D0462 Carcinoma in situ of skin of left upper limb, including shoulder: Secondary | ICD-10-CM | POA: Diagnosis not present

## 2019-11-11 DIAGNOSIS — L821 Other seborrheic keratosis: Secondary | ICD-10-CM | POA: Diagnosis not present

## 2019-11-11 DIAGNOSIS — C44622 Squamous cell carcinoma of skin of right upper limb, including shoulder: Secondary | ICD-10-CM | POA: Diagnosis not present

## 2019-11-11 DIAGNOSIS — C44319 Basal cell carcinoma of skin of other parts of face: Secondary | ICD-10-CM | POA: Diagnosis not present

## 2019-11-11 DIAGNOSIS — L578 Other skin changes due to chronic exposure to nonionizing radiation: Secondary | ICD-10-CM | POA: Diagnosis not present

## 2019-11-11 DIAGNOSIS — L570 Actinic keratosis: Secondary | ICD-10-CM | POA: Diagnosis not present

## 2019-11-13 DIAGNOSIS — C44319 Basal cell carcinoma of skin of other parts of face: Secondary | ICD-10-CM | POA: Diagnosis not present

## 2019-11-21 DIAGNOSIS — Z933 Colostomy status: Secondary | ICD-10-CM | POA: Diagnosis not present

## 2019-11-21 DIAGNOSIS — C20 Malignant neoplasm of rectum: Secondary | ICD-10-CM | POA: Diagnosis not present

## 2019-11-25 DIAGNOSIS — D0462 Carcinoma in situ of skin of left upper limb, including shoulder: Secondary | ICD-10-CM | POA: Diagnosis not present

## 2019-12-19 DIAGNOSIS — C20 Malignant neoplasm of rectum: Secondary | ICD-10-CM | POA: Diagnosis not present

## 2019-12-19 DIAGNOSIS — Z933 Colostomy status: Secondary | ICD-10-CM | POA: Diagnosis not present

## 2019-12-23 DIAGNOSIS — L821 Other seborrheic keratosis: Secondary | ICD-10-CM | POA: Diagnosis not present

## 2019-12-23 DIAGNOSIS — L570 Actinic keratosis: Secondary | ICD-10-CM | POA: Diagnosis not present

## 2019-12-23 DIAGNOSIS — L578 Other skin changes due to chronic exposure to nonionizing radiation: Secondary | ICD-10-CM | POA: Diagnosis not present

## 2019-12-23 DIAGNOSIS — C44722 Squamous cell carcinoma of skin of right lower limb, including hip: Secondary | ICD-10-CM | POA: Diagnosis not present

## 2019-12-30 DIAGNOSIS — Z933 Colostomy status: Secondary | ICD-10-CM | POA: Diagnosis not present

## 2019-12-30 DIAGNOSIS — C20 Malignant neoplasm of rectum: Secondary | ICD-10-CM | POA: Diagnosis not present

## 2020-01-13 DIAGNOSIS — D509 Iron deficiency anemia, unspecified: Secondary | ICD-10-CM | POA: Diagnosis not present

## 2020-01-13 DIAGNOSIS — Z85038 Personal history of other malignant neoplasm of large intestine: Secondary | ICD-10-CM | POA: Diagnosis not present

## 2020-01-13 DIAGNOSIS — E039 Hypothyroidism, unspecified: Secondary | ICD-10-CM | POA: Diagnosis not present

## 2020-01-13 DIAGNOSIS — E119 Type 2 diabetes mellitus without complications: Secondary | ICD-10-CM | POA: Diagnosis not present

## 2020-01-20 DIAGNOSIS — E119 Type 2 diabetes mellitus without complications: Secondary | ICD-10-CM | POA: Diagnosis not present

## 2020-01-20 DIAGNOSIS — I10 Essential (primary) hypertension: Secondary | ICD-10-CM | POA: Diagnosis not present

## 2020-01-20 DIAGNOSIS — E782 Mixed hyperlipidemia: Secondary | ICD-10-CM | POA: Diagnosis not present

## 2020-01-20 DIAGNOSIS — E039 Hypothyroidism, unspecified: Secondary | ICD-10-CM | POA: Diagnosis not present

## 2020-01-20 DIAGNOSIS — G4700 Insomnia, unspecified: Secondary | ICD-10-CM | POA: Diagnosis not present

## 2020-01-21 DIAGNOSIS — C20 Malignant neoplasm of rectum: Secondary | ICD-10-CM | POA: Diagnosis not present

## 2020-01-21 DIAGNOSIS — Z933 Colostomy status: Secondary | ICD-10-CM | POA: Diagnosis not present

## 2020-01-27 DIAGNOSIS — C44722 Squamous cell carcinoma of skin of right lower limb, including hip: Secondary | ICD-10-CM | POA: Diagnosis not present

## 2020-01-27 DIAGNOSIS — L570 Actinic keratosis: Secondary | ICD-10-CM | POA: Diagnosis not present

## 2020-03-01 DIAGNOSIS — Z933 Colostomy status: Secondary | ICD-10-CM | POA: Diagnosis not present

## 2020-03-01 DIAGNOSIS — C20 Malignant neoplasm of rectum: Secondary | ICD-10-CM | POA: Diagnosis not present

## 2020-03-23 DIAGNOSIS — C20 Malignant neoplasm of rectum: Secondary | ICD-10-CM | POA: Diagnosis not present

## 2020-03-23 DIAGNOSIS — Z933 Colostomy status: Secondary | ICD-10-CM | POA: Diagnosis not present

## 2020-03-30 DIAGNOSIS — C44719 Basal cell carcinoma of skin of left lower limb, including hip: Secondary | ICD-10-CM | POA: Diagnosis not present

## 2020-03-30 DIAGNOSIS — L570 Actinic keratosis: Secondary | ICD-10-CM | POA: Diagnosis not present

## 2020-04-21 DIAGNOSIS — Z933 Colostomy status: Secondary | ICD-10-CM | POA: Diagnosis not present

## 2020-04-21 DIAGNOSIS — C20 Malignant neoplasm of rectum: Secondary | ICD-10-CM | POA: Diagnosis not present

## 2020-05-18 DIAGNOSIS — Z85038 Personal history of other malignant neoplasm of large intestine: Secondary | ICD-10-CM | POA: Diagnosis not present

## 2020-05-18 DIAGNOSIS — D509 Iron deficiency anemia, unspecified: Secondary | ICD-10-CM | POA: Diagnosis not present

## 2020-05-18 DIAGNOSIS — E119 Type 2 diabetes mellitus without complications: Secondary | ICD-10-CM | POA: Diagnosis not present

## 2020-05-18 DIAGNOSIS — E039 Hypothyroidism, unspecified: Secondary | ICD-10-CM | POA: Diagnosis not present

## 2020-05-21 DIAGNOSIS — Z933 Colostomy status: Secondary | ICD-10-CM | POA: Diagnosis not present

## 2020-05-21 DIAGNOSIS — C20 Malignant neoplasm of rectum: Secondary | ICD-10-CM | POA: Diagnosis not present

## 2020-05-25 DIAGNOSIS — G620 Drug-induced polyneuropathy: Secondary | ICD-10-CM | POA: Diagnosis not present

## 2020-05-25 DIAGNOSIS — Z139 Encounter for screening, unspecified: Secondary | ICD-10-CM | POA: Diagnosis not present

## 2020-05-25 DIAGNOSIS — Z933 Colostomy status: Secondary | ICD-10-CM | POA: Diagnosis not present

## 2020-05-25 DIAGNOSIS — D509 Iron deficiency anemia, unspecified: Secondary | ICD-10-CM | POA: Diagnosis not present

## 2020-05-25 DIAGNOSIS — C20 Malignant neoplasm of rectum: Secondary | ICD-10-CM | POA: Diagnosis not present

## 2020-05-25 DIAGNOSIS — E119 Type 2 diabetes mellitus without complications: Secondary | ICD-10-CM | POA: Diagnosis not present

## 2020-06-22 DIAGNOSIS — C20 Malignant neoplasm of rectum: Secondary | ICD-10-CM | POA: Diagnosis not present

## 2020-06-22 DIAGNOSIS — Z933 Colostomy status: Secondary | ICD-10-CM | POA: Diagnosis not present

## 2020-07-06 DIAGNOSIS — C44729 Squamous cell carcinoma of skin of left lower limb, including hip: Secondary | ICD-10-CM | POA: Diagnosis not present

## 2020-07-06 DIAGNOSIS — L570 Actinic keratosis: Secondary | ICD-10-CM | POA: Diagnosis not present

## 2020-07-06 DIAGNOSIS — C44629 Squamous cell carcinoma of skin of left upper limb, including shoulder: Secondary | ICD-10-CM | POA: Diagnosis not present

## 2020-07-22 DIAGNOSIS — C20 Malignant neoplasm of rectum: Secondary | ICD-10-CM | POA: Diagnosis not present

## 2020-07-22 DIAGNOSIS — Z933 Colostomy status: Secondary | ICD-10-CM | POA: Diagnosis not present

## 2020-08-02 DIAGNOSIS — C20 Malignant neoplasm of rectum: Secondary | ICD-10-CM | POA: Diagnosis not present

## 2020-08-02 DIAGNOSIS — Z933 Colostomy status: Secondary | ICD-10-CM | POA: Diagnosis not present

## 2020-08-20 DIAGNOSIS — Z933 Colostomy status: Secondary | ICD-10-CM | POA: Diagnosis not present

## 2020-08-20 DIAGNOSIS — C20 Malignant neoplasm of rectum: Secondary | ICD-10-CM | POA: Diagnosis not present

## 2020-09-07 DIAGNOSIS — L821 Other seborrheic keratosis: Secondary | ICD-10-CM | POA: Diagnosis not present

## 2020-09-07 DIAGNOSIS — L578 Other skin changes due to chronic exposure to nonionizing radiation: Secondary | ICD-10-CM | POA: Diagnosis not present

## 2020-09-07 DIAGNOSIS — D0461 Carcinoma in situ of skin of right upper limb, including shoulder: Secondary | ICD-10-CM | POA: Diagnosis not present

## 2020-09-07 DIAGNOSIS — L570 Actinic keratosis: Secondary | ICD-10-CM | POA: Diagnosis not present

## 2020-09-14 ENCOUNTER — Other Ambulatory Visit: Payer: Self-pay | Admitting: Nurse Practitioner

## 2020-09-14 DIAGNOSIS — Z1231 Encounter for screening mammogram for malignant neoplasm of breast: Secondary | ICD-10-CM

## 2020-09-15 DIAGNOSIS — Z933 Colostomy status: Secondary | ICD-10-CM | POA: Diagnosis not present

## 2020-09-15 DIAGNOSIS — C20 Malignant neoplasm of rectum: Secondary | ICD-10-CM | POA: Diagnosis not present

## 2020-09-21 DIAGNOSIS — E039 Hypothyroidism, unspecified: Secondary | ICD-10-CM | POA: Diagnosis not present

## 2020-09-21 DIAGNOSIS — D509 Iron deficiency anemia, unspecified: Secondary | ICD-10-CM | POA: Diagnosis not present

## 2020-09-21 DIAGNOSIS — Z85038 Personal history of other malignant neoplasm of large intestine: Secondary | ICD-10-CM | POA: Diagnosis not present

## 2020-09-21 DIAGNOSIS — E119 Type 2 diabetes mellitus without complications: Secondary | ICD-10-CM | POA: Diagnosis not present

## 2020-09-28 DIAGNOSIS — D509 Iron deficiency anemia, unspecified: Secondary | ICD-10-CM | POA: Diagnosis not present

## 2020-09-28 DIAGNOSIS — G620 Drug-induced polyneuropathy: Secondary | ICD-10-CM | POA: Diagnosis not present

## 2020-09-28 DIAGNOSIS — E119 Type 2 diabetes mellitus without complications: Secondary | ICD-10-CM | POA: Diagnosis not present

## 2020-09-28 DIAGNOSIS — Z9181 History of falling: Secondary | ICD-10-CM | POA: Diagnosis not present

## 2020-09-28 DIAGNOSIS — Z85038 Personal history of other malignant neoplasm of large intestine: Secondary | ICD-10-CM | POA: Diagnosis not present

## 2020-09-28 DIAGNOSIS — E782 Mixed hyperlipidemia: Secondary | ICD-10-CM | POA: Diagnosis not present

## 2020-09-28 DIAGNOSIS — Z933 Colostomy status: Secondary | ICD-10-CM | POA: Diagnosis not present

## 2020-09-28 DIAGNOSIS — I10 Essential (primary) hypertension: Secondary | ICD-10-CM | POA: Diagnosis not present

## 2020-09-28 DIAGNOSIS — E039 Hypothyroidism, unspecified: Secondary | ICD-10-CM | POA: Diagnosis not present

## 2020-09-28 DIAGNOSIS — G4700 Insomnia, unspecified: Secondary | ICD-10-CM | POA: Diagnosis not present

## 2020-10-14 DIAGNOSIS — Z933 Colostomy status: Secondary | ICD-10-CM | POA: Diagnosis not present

## 2020-10-14 DIAGNOSIS — C20 Malignant neoplasm of rectum: Secondary | ICD-10-CM | POA: Diagnosis not present

## 2020-10-18 DIAGNOSIS — C20 Malignant neoplasm of rectum: Secondary | ICD-10-CM | POA: Diagnosis not present

## 2020-10-18 DIAGNOSIS — Z933 Colostomy status: Secondary | ICD-10-CM | POA: Diagnosis not present

## 2020-10-25 ENCOUNTER — Other Ambulatory Visit: Payer: Self-pay

## 2020-10-25 ENCOUNTER — Ambulatory Visit
Admission: RE | Admit: 2020-10-25 | Discharge: 2020-10-25 | Disposition: A | Discharge status: Home / Self Care | Payer: Medicare Other | Source: Ambulatory Visit | Attending: Nurse Practitioner | Admitting: Nurse Practitioner

## 2020-10-25 DIAGNOSIS — Z1231 Encounter for screening mammogram for malignant neoplasm of breast: Secondary | ICD-10-CM

## 2020-10-26 ENCOUNTER — Other Ambulatory Visit: Payer: Self-pay | Admitting: Nurse Practitioner

## 2020-10-26 DIAGNOSIS — R928 Other abnormal and inconclusive findings on diagnostic imaging of breast: Secondary | ICD-10-CM

## 2020-10-26 DIAGNOSIS — N6310 Unspecified lump in the right breast, unspecified quadrant: Secondary | ICD-10-CM

## 2020-11-01 DIAGNOSIS — H40003 Preglaucoma, unspecified, bilateral: Secondary | ICD-10-CM | POA: Diagnosis not present

## 2020-11-12 DIAGNOSIS — C20 Malignant neoplasm of rectum: Secondary | ICD-10-CM | POA: Diagnosis not present

## 2020-11-12 DIAGNOSIS — Z933 Colostomy status: Secondary | ICD-10-CM | POA: Diagnosis not present

## 2020-11-22 DIAGNOSIS — C20 Malignant neoplasm of rectum: Secondary | ICD-10-CM | POA: Diagnosis not present

## 2020-11-22 DIAGNOSIS — Z933 Colostomy status: Secondary | ICD-10-CM | POA: Diagnosis not present

## 2020-12-06 DIAGNOSIS — C44729 Squamous cell carcinoma of skin of left lower limb, including hip: Secondary | ICD-10-CM | POA: Diagnosis not present

## 2020-12-06 DIAGNOSIS — L570 Actinic keratosis: Secondary | ICD-10-CM | POA: Diagnosis not present

## 2020-12-16 DIAGNOSIS — Z933 Colostomy status: Secondary | ICD-10-CM | POA: Diagnosis not present

## 2020-12-16 DIAGNOSIS — C20 Malignant neoplasm of rectum: Secondary | ICD-10-CM | POA: Diagnosis not present

## 2021-01-10 ENCOUNTER — Ambulatory Visit
Admission: RE | Admit: 2021-01-10 | Discharge: 2021-01-10 | Disposition: A | Discharge status: Home / Self Care | Payer: Medicare Other | Source: Ambulatory Visit | Attending: Nurse Practitioner | Admitting: Nurse Practitioner

## 2021-01-10 ENCOUNTER — Other Ambulatory Visit: Payer: Self-pay

## 2021-01-10 DIAGNOSIS — R928 Other abnormal and inconclusive findings on diagnostic imaging of breast: Secondary | ICD-10-CM

## 2021-01-10 DIAGNOSIS — N6310 Unspecified lump in the right breast, unspecified quadrant: Secondary | ICD-10-CM | POA: Diagnosis not present

## 2021-01-10 DIAGNOSIS — R922 Inconclusive mammogram: Secondary | ICD-10-CM | POA: Diagnosis not present

## 2021-01-13 DIAGNOSIS — Z933 Colostomy status: Secondary | ICD-10-CM | POA: Diagnosis not present

## 2021-01-13 DIAGNOSIS — C20 Malignant neoplasm of rectum: Secondary | ICD-10-CM | POA: Diagnosis not present

## 2021-01-25 DIAGNOSIS — E039 Hypothyroidism, unspecified: Secondary | ICD-10-CM | POA: Diagnosis not present

## 2021-01-25 DIAGNOSIS — Z79899 Other long term (current) drug therapy: Secondary | ICD-10-CM | POA: Diagnosis not present

## 2021-01-25 DIAGNOSIS — E119 Type 2 diabetes mellitus without complications: Secondary | ICD-10-CM | POA: Diagnosis not present

## 2021-01-25 DIAGNOSIS — Z85038 Personal history of other malignant neoplasm of large intestine: Secondary | ICD-10-CM | POA: Diagnosis not present

## 2021-01-25 DIAGNOSIS — E782 Mixed hyperlipidemia: Secondary | ICD-10-CM | POA: Diagnosis not present

## 2021-02-01 DIAGNOSIS — E782 Mixed hyperlipidemia: Secondary | ICD-10-CM | POA: Diagnosis not present

## 2021-02-01 DIAGNOSIS — Z85038 Personal history of other malignant neoplasm of large intestine: Secondary | ICD-10-CM | POA: Diagnosis not present

## 2021-02-01 DIAGNOSIS — I10 Essential (primary) hypertension: Secondary | ICD-10-CM | POA: Diagnosis not present

## 2021-02-01 DIAGNOSIS — Z933 Colostomy status: Secondary | ICD-10-CM | POA: Diagnosis not present

## 2021-02-01 DIAGNOSIS — D509 Iron deficiency anemia, unspecified: Secondary | ICD-10-CM | POA: Diagnosis not present

## 2021-02-01 DIAGNOSIS — E119 Type 2 diabetes mellitus without complications: Secondary | ICD-10-CM | POA: Diagnosis not present

## 2021-02-01 DIAGNOSIS — E039 Hypothyroidism, unspecified: Secondary | ICD-10-CM | POA: Diagnosis not present

## 2021-02-01 DIAGNOSIS — G620 Drug-induced polyneuropathy: Secondary | ICD-10-CM | POA: Diagnosis not present

## 2021-02-01 DIAGNOSIS — G4700 Insomnia, unspecified: Secondary | ICD-10-CM | POA: Diagnosis not present

## 2021-02-15 DIAGNOSIS — C20 Malignant neoplasm of rectum: Secondary | ICD-10-CM | POA: Diagnosis not present

## 2021-02-15 DIAGNOSIS — Z933 Colostomy status: Secondary | ICD-10-CM | POA: Diagnosis not present

## 2021-03-14 DIAGNOSIS — L570 Actinic keratosis: Secondary | ICD-10-CM | POA: Diagnosis not present

## 2021-03-14 DIAGNOSIS — C44729 Squamous cell carcinoma of skin of left lower limb, including hip: Secondary | ICD-10-CM | POA: Diagnosis not present

## 2021-03-17 DIAGNOSIS — C20 Malignant neoplasm of rectum: Secondary | ICD-10-CM | POA: Diagnosis not present

## 2021-03-17 DIAGNOSIS — Z933 Colostomy status: Secondary | ICD-10-CM | POA: Diagnosis not present

## 2021-04-17 DIAGNOSIS — C20 Malignant neoplasm of rectum: Secondary | ICD-10-CM | POA: Diagnosis not present

## 2021-04-17 DIAGNOSIS — Z933 Colostomy status: Secondary | ICD-10-CM | POA: Diagnosis not present

## 2021-05-18 DIAGNOSIS — Z933 Colostomy status: Secondary | ICD-10-CM | POA: Diagnosis not present

## 2021-05-18 DIAGNOSIS — C20 Malignant neoplasm of rectum: Secondary | ICD-10-CM | POA: Diagnosis not present

## 2021-06-06 DIAGNOSIS — E119 Type 2 diabetes mellitus without complications: Secondary | ICD-10-CM | POA: Diagnosis not present

## 2021-06-06 DIAGNOSIS — E039 Hypothyroidism, unspecified: Secondary | ICD-10-CM | POA: Diagnosis not present

## 2021-06-06 DIAGNOSIS — E782 Mixed hyperlipidemia: Secondary | ICD-10-CM | POA: Diagnosis not present

## 2021-06-13 DIAGNOSIS — I10 Essential (primary) hypertension: Secondary | ICD-10-CM | POA: Diagnosis not present

## 2021-06-13 DIAGNOSIS — E039 Hypothyroidism, unspecified: Secondary | ICD-10-CM | POA: Diagnosis not present

## 2021-06-13 DIAGNOSIS — Z139 Encounter for screening, unspecified: Secondary | ICD-10-CM | POA: Diagnosis not present

## 2021-06-13 DIAGNOSIS — R809 Proteinuria, unspecified: Secondary | ICD-10-CM | POA: Diagnosis not present

## 2021-06-13 DIAGNOSIS — N1831 Chronic kidney disease, stage 3a: Secondary | ICD-10-CM | POA: Diagnosis not present

## 2021-06-13 DIAGNOSIS — G4700 Insomnia, unspecified: Secondary | ICD-10-CM | POA: Diagnosis not present

## 2021-06-13 DIAGNOSIS — K219 Gastro-esophageal reflux disease without esophagitis: Secondary | ICD-10-CM | POA: Diagnosis not present

## 2021-06-13 DIAGNOSIS — E782 Mixed hyperlipidemia: Secondary | ICD-10-CM | POA: Diagnosis not present

## 2021-06-13 DIAGNOSIS — E1129 Type 2 diabetes mellitus with other diabetic kidney complication: Secondary | ICD-10-CM | POA: Diagnosis not present

## 2021-06-13 DIAGNOSIS — Z79899 Other long term (current) drug therapy: Secondary | ICD-10-CM | POA: Diagnosis not present

## 2021-06-16 DIAGNOSIS — Z933 Colostomy status: Secondary | ICD-10-CM | POA: Diagnosis not present

## 2021-06-16 DIAGNOSIS — C20 Malignant neoplasm of rectum: Secondary | ICD-10-CM | POA: Diagnosis not present

## 2021-07-18 DIAGNOSIS — Z933 Colostomy status: Secondary | ICD-10-CM | POA: Diagnosis not present

## 2021-07-18 DIAGNOSIS — C20 Malignant neoplasm of rectum: Secondary | ICD-10-CM | POA: Diagnosis not present

## 2021-08-19 DIAGNOSIS — Z933 Colostomy status: Secondary | ICD-10-CM | POA: Diagnosis not present

## 2021-08-19 DIAGNOSIS — C20 Malignant neoplasm of rectum: Secondary | ICD-10-CM | POA: Diagnosis not present

## 2021-09-15 DIAGNOSIS — Z933 Colostomy status: Secondary | ICD-10-CM | POA: Diagnosis not present

## 2021-09-15 DIAGNOSIS — C20 Malignant neoplasm of rectum: Secondary | ICD-10-CM | POA: Diagnosis not present

## 2021-10-14 DIAGNOSIS — C20 Malignant neoplasm of rectum: Secondary | ICD-10-CM | POA: Diagnosis not present

## 2021-10-14 DIAGNOSIS — Z933 Colostomy status: Secondary | ICD-10-CM | POA: Diagnosis not present

## 2021-11-16 DIAGNOSIS — C20 Malignant neoplasm of rectum: Secondary | ICD-10-CM | POA: Diagnosis not present

## 2021-11-16 DIAGNOSIS — Z933 Colostomy status: Secondary | ICD-10-CM | POA: Diagnosis not present

## 2021-12-12 DIAGNOSIS — L570 Actinic keratosis: Secondary | ICD-10-CM | POA: Diagnosis not present

## 2021-12-12 DIAGNOSIS — E782 Mixed hyperlipidemia: Secondary | ICD-10-CM | POA: Diagnosis not present

## 2021-12-12 DIAGNOSIS — Z79899 Other long term (current) drug therapy: Secondary | ICD-10-CM | POA: Diagnosis not present

## 2021-12-12 DIAGNOSIS — E039 Hypothyroidism, unspecified: Secondary | ICD-10-CM | POA: Diagnosis not present

## 2021-12-12 DIAGNOSIS — E1129 Type 2 diabetes mellitus with other diabetic kidney complication: Secondary | ICD-10-CM | POA: Diagnosis not present

## 2021-12-16 DIAGNOSIS — Z933 Colostomy status: Secondary | ICD-10-CM | POA: Diagnosis not present

## 2021-12-16 DIAGNOSIS — C20 Malignant neoplasm of rectum: Secondary | ICD-10-CM | POA: Diagnosis not present

## 2021-12-19 DIAGNOSIS — E1129 Type 2 diabetes mellitus with other diabetic kidney complication: Secondary | ICD-10-CM | POA: Diagnosis not present

## 2021-12-19 DIAGNOSIS — Z79899 Other long term (current) drug therapy: Secondary | ICD-10-CM | POA: Diagnosis not present

## 2021-12-19 DIAGNOSIS — N1831 Chronic kidney disease, stage 3a: Secondary | ICD-10-CM | POA: Diagnosis not present

## 2021-12-19 DIAGNOSIS — I10 Essential (primary) hypertension: Secondary | ICD-10-CM | POA: Diagnosis not present

## 2021-12-19 DIAGNOSIS — G4700 Insomnia, unspecified: Secondary | ICD-10-CM | POA: Diagnosis not present

## 2021-12-19 DIAGNOSIS — E039 Hypothyroidism, unspecified: Secondary | ICD-10-CM | POA: Diagnosis not present

## 2021-12-19 DIAGNOSIS — K219 Gastro-esophageal reflux disease without esophagitis: Secondary | ICD-10-CM | POA: Diagnosis not present

## 2021-12-19 DIAGNOSIS — D509 Iron deficiency anemia, unspecified: Secondary | ICD-10-CM | POA: Diagnosis not present

## 2021-12-19 DIAGNOSIS — R809 Proteinuria, unspecified: Secondary | ICD-10-CM | POA: Diagnosis not present

## 2021-12-19 DIAGNOSIS — E782 Mixed hyperlipidemia: Secondary | ICD-10-CM | POA: Diagnosis not present

## 2021-12-28 DIAGNOSIS — H2513 Age-related nuclear cataract, bilateral: Secondary | ICD-10-CM | POA: Diagnosis not present

## 2021-12-28 DIAGNOSIS — H40003 Preglaucoma, unspecified, bilateral: Secondary | ICD-10-CM | POA: Diagnosis not present

## 2021-12-28 DIAGNOSIS — E119 Type 2 diabetes mellitus without complications: Secondary | ICD-10-CM | POA: Diagnosis not present

## 2021-12-28 DIAGNOSIS — Z7984 Long term (current) use of oral hypoglycemic drugs: Secondary | ICD-10-CM | POA: Diagnosis not present

## 2022-01-16 DIAGNOSIS — Z933 Colostomy status: Secondary | ICD-10-CM | POA: Diagnosis not present

## 2022-01-16 DIAGNOSIS — C20 Malignant neoplasm of rectum: Secondary | ICD-10-CM | POA: Diagnosis not present

## 2022-01-31 DIAGNOSIS — I10 Essential (primary) hypertension: Secondary | ICD-10-CM | POA: Diagnosis not present

## 2022-01-31 DIAGNOSIS — M79651 Pain in right thigh: Secondary | ICD-10-CM | POA: Diagnosis not present

## 2022-02-16 DIAGNOSIS — C20 Malignant neoplasm of rectum: Secondary | ICD-10-CM | POA: Diagnosis not present

## 2022-02-16 DIAGNOSIS — Z933 Colostomy status: Secondary | ICD-10-CM | POA: Diagnosis not present

## 2022-03-17 DIAGNOSIS — C20 Malignant neoplasm of rectum: Secondary | ICD-10-CM | POA: Diagnosis not present

## 2022-03-17 DIAGNOSIS — Z933 Colostomy status: Secondary | ICD-10-CM | POA: Diagnosis not present

## 2022-04-13 DIAGNOSIS — C20 Malignant neoplasm of rectum: Secondary | ICD-10-CM | POA: Diagnosis not present

## 2022-04-13 DIAGNOSIS — Z933 Colostomy status: Secondary | ICD-10-CM | POA: Diagnosis not present

## 2022-04-18 DIAGNOSIS — Z933 Colostomy status: Secondary | ICD-10-CM | POA: Diagnosis not present

## 2022-04-18 DIAGNOSIS — C20 Malignant neoplasm of rectum: Secondary | ICD-10-CM | POA: Diagnosis not present

## 2022-05-18 DIAGNOSIS — C20 Malignant neoplasm of rectum: Secondary | ICD-10-CM | POA: Diagnosis not present

## 2022-05-18 DIAGNOSIS — Z933 Colostomy status: Secondary | ICD-10-CM | POA: Diagnosis not present

## 2022-06-13 DIAGNOSIS — Z85038 Personal history of other malignant neoplasm of large intestine: Secondary | ICD-10-CM | POA: Diagnosis not present

## 2022-06-13 DIAGNOSIS — D509 Iron deficiency anemia, unspecified: Secondary | ICD-10-CM | POA: Diagnosis not present

## 2022-06-13 DIAGNOSIS — D2239 Melanocytic nevi of other parts of face: Secondary | ICD-10-CM | POA: Diagnosis not present

## 2022-06-13 DIAGNOSIS — D485 Neoplasm of uncertain behavior of skin: Secondary | ICD-10-CM | POA: Diagnosis not present

## 2022-06-13 DIAGNOSIS — E039 Hypothyroidism, unspecified: Secondary | ICD-10-CM | POA: Diagnosis not present

## 2022-06-13 DIAGNOSIS — E1129 Type 2 diabetes mellitus with other diabetic kidney complication: Secondary | ICD-10-CM | POA: Diagnosis not present

## 2022-06-13 DIAGNOSIS — L570 Actinic keratosis: Secondary | ICD-10-CM | POA: Diagnosis not present

## 2022-06-13 DIAGNOSIS — E782 Mixed hyperlipidemia: Secondary | ICD-10-CM | POA: Diagnosis not present

## 2022-06-13 DIAGNOSIS — L578 Other skin changes due to chronic exposure to nonionizing radiation: Secondary | ICD-10-CM | POA: Diagnosis not present

## 2022-06-16 DIAGNOSIS — Z933 Colostomy status: Secondary | ICD-10-CM | POA: Diagnosis not present

## 2022-06-16 DIAGNOSIS — C20 Malignant neoplasm of rectum: Secondary | ICD-10-CM | POA: Diagnosis not present

## 2022-06-20 DIAGNOSIS — G4700 Insomnia, unspecified: Secondary | ICD-10-CM | POA: Diagnosis not present

## 2022-06-20 DIAGNOSIS — R809 Proteinuria, unspecified: Secondary | ICD-10-CM | POA: Diagnosis not present

## 2022-06-20 DIAGNOSIS — Z79899 Other long term (current) drug therapy: Secondary | ICD-10-CM | POA: Diagnosis not present

## 2022-06-20 DIAGNOSIS — D509 Iron deficiency anemia, unspecified: Secondary | ICD-10-CM | POA: Diagnosis not present

## 2022-06-20 DIAGNOSIS — E1129 Type 2 diabetes mellitus with other diabetic kidney complication: Secondary | ICD-10-CM | POA: Diagnosis not present

## 2022-06-20 DIAGNOSIS — E782 Mixed hyperlipidemia: Secondary | ICD-10-CM | POA: Diagnosis not present

## 2022-06-20 DIAGNOSIS — Z139 Encounter for screening, unspecified: Secondary | ICD-10-CM | POA: Diagnosis not present

## 2022-06-20 DIAGNOSIS — E871 Hypo-osmolality and hyponatremia: Secondary | ICD-10-CM | POA: Diagnosis not present

## 2022-06-20 DIAGNOSIS — E039 Hypothyroidism, unspecified: Secondary | ICD-10-CM | POA: Diagnosis not present

## 2022-06-20 DIAGNOSIS — Z9181 History of falling: Secondary | ICD-10-CM | POA: Diagnosis not present

## 2022-06-20 DIAGNOSIS — K219 Gastro-esophageal reflux disease without esophagitis: Secondary | ICD-10-CM | POA: Diagnosis not present

## 2022-06-20 DIAGNOSIS — I10 Essential (primary) hypertension: Secondary | ICD-10-CM | POA: Diagnosis not present

## 2022-07-13 DIAGNOSIS — D0472 Carcinoma in situ of skin of left lower limb, including hip: Secondary | ICD-10-CM | POA: Diagnosis not present

## 2022-07-19 DIAGNOSIS — Z933 Colostomy status: Secondary | ICD-10-CM | POA: Diagnosis not present

## 2022-07-19 DIAGNOSIS — C20 Malignant neoplasm of rectum: Secondary | ICD-10-CM | POA: Diagnosis not present

## 2022-07-25 DIAGNOSIS — E871 Hypo-osmolality and hyponatremia: Secondary | ICD-10-CM | POA: Diagnosis not present

## 2022-08-17 DIAGNOSIS — Z933 Colostomy status: Secondary | ICD-10-CM | POA: Diagnosis not present

## 2022-08-17 DIAGNOSIS — C20 Malignant neoplasm of rectum: Secondary | ICD-10-CM | POA: Diagnosis not present

## 2022-09-02 DIAGNOSIS — Z933 Colostomy status: Secondary | ICD-10-CM | POA: Diagnosis not present

## 2022-09-15 DIAGNOSIS — C20 Malignant neoplasm of rectum: Secondary | ICD-10-CM | POA: Diagnosis not present

## 2022-09-15 DIAGNOSIS — Z933 Colostomy status: Secondary | ICD-10-CM | POA: Diagnosis not present

## 2022-10-09 DIAGNOSIS — D0471 Carcinoma in situ of skin of right lower limb, including hip: Secondary | ICD-10-CM | POA: Diagnosis not present

## 2022-10-09 DIAGNOSIS — L570 Actinic keratosis: Secondary | ICD-10-CM | POA: Diagnosis not present

## 2022-10-09 DIAGNOSIS — C44712 Basal cell carcinoma of skin of right lower limb, including hip: Secondary | ICD-10-CM | POA: Diagnosis not present

## 2022-10-09 DIAGNOSIS — L820 Inflamed seborrheic keratosis: Secondary | ICD-10-CM | POA: Diagnosis not present

## 2022-10-09 DIAGNOSIS — D0472 Carcinoma in situ of skin of left lower limb, including hip: Secondary | ICD-10-CM | POA: Diagnosis not present

## 2022-10-18 DIAGNOSIS — C20 Malignant neoplasm of rectum: Secondary | ICD-10-CM | POA: Diagnosis not present

## 2022-10-18 DIAGNOSIS — Z933 Colostomy status: Secondary | ICD-10-CM | POA: Diagnosis not present

## 2022-11-16 DIAGNOSIS — C20 Malignant neoplasm of rectum: Secondary | ICD-10-CM | POA: Diagnosis not present

## 2022-11-16 DIAGNOSIS — Z933 Colostomy status: Secondary | ICD-10-CM | POA: Diagnosis not present

## 2022-12-01 DIAGNOSIS — C20 Malignant neoplasm of rectum: Secondary | ICD-10-CM | POA: Diagnosis not present

## 2022-12-01 DIAGNOSIS — Z933 Colostomy status: Secondary | ICD-10-CM | POA: Diagnosis not present

## 2022-12-14 DIAGNOSIS — E782 Mixed hyperlipidemia: Secondary | ICD-10-CM | POA: Diagnosis not present

## 2022-12-14 DIAGNOSIS — E039 Hypothyroidism, unspecified: Secondary | ICD-10-CM | POA: Diagnosis not present

## 2022-12-14 DIAGNOSIS — E1129 Type 2 diabetes mellitus with other diabetic kidney complication: Secondary | ICD-10-CM | POA: Diagnosis not present

## 2022-12-14 DIAGNOSIS — Z79899 Other long term (current) drug therapy: Secondary | ICD-10-CM | POA: Diagnosis not present

## 2022-12-14 DIAGNOSIS — D509 Iron deficiency anemia, unspecified: Secondary | ICD-10-CM | POA: Diagnosis not present

## 2022-12-15 DIAGNOSIS — Z933 Colostomy status: Secondary | ICD-10-CM | POA: Diagnosis not present

## 2022-12-15 DIAGNOSIS — C20 Malignant neoplasm of rectum: Secondary | ICD-10-CM | POA: Diagnosis not present

## 2022-12-26 DIAGNOSIS — Z933 Colostomy status: Secondary | ICD-10-CM | POA: Diagnosis not present

## 2022-12-26 DIAGNOSIS — D509 Iron deficiency anemia, unspecified: Secondary | ICD-10-CM | POA: Diagnosis not present

## 2022-12-26 DIAGNOSIS — I10 Essential (primary) hypertension: Secondary | ICD-10-CM | POA: Diagnosis not present

## 2022-12-26 DIAGNOSIS — G2581 Restless legs syndrome: Secondary | ICD-10-CM | POA: Diagnosis not present

## 2022-12-26 DIAGNOSIS — E782 Mixed hyperlipidemia: Secondary | ICD-10-CM | POA: Diagnosis not present

## 2022-12-26 DIAGNOSIS — R809 Proteinuria, unspecified: Secondary | ICD-10-CM | POA: Diagnosis not present

## 2022-12-26 DIAGNOSIS — E039 Hypothyroidism, unspecified: Secondary | ICD-10-CM | POA: Diagnosis not present

## 2022-12-26 DIAGNOSIS — Z79899 Other long term (current) drug therapy: Secondary | ICD-10-CM | POA: Diagnosis not present

## 2022-12-26 DIAGNOSIS — E1129 Type 2 diabetes mellitus with other diabetic kidney complication: Secondary | ICD-10-CM | POA: Diagnosis not present

## 2022-12-26 DIAGNOSIS — G4700 Insomnia, unspecified: Secondary | ICD-10-CM | POA: Diagnosis not present

## 2022-12-26 DIAGNOSIS — K219 Gastro-esophageal reflux disease without esophagitis: Secondary | ICD-10-CM | POA: Diagnosis not present

## 2023-01-17 DIAGNOSIS — Z933 Colostomy status: Secondary | ICD-10-CM | POA: Diagnosis not present

## 2023-01-17 DIAGNOSIS — C20 Malignant neoplasm of rectum: Secondary | ICD-10-CM | POA: Diagnosis not present

## 2023-02-12 DIAGNOSIS — L570 Actinic keratosis: Secondary | ICD-10-CM | POA: Diagnosis not present

## 2023-02-12 DIAGNOSIS — D0472 Carcinoma in situ of skin of left lower limb, including hip: Secondary | ICD-10-CM | POA: Diagnosis not present

## 2023-02-12 DIAGNOSIS — C44712 Basal cell carcinoma of skin of right lower limb, including hip: Secondary | ICD-10-CM | POA: Diagnosis not present

## 2023-02-15 DIAGNOSIS — E119 Type 2 diabetes mellitus without complications: Secondary | ICD-10-CM | POA: Diagnosis not present

## 2023-02-15 DIAGNOSIS — H40003 Preglaucoma, unspecified, bilateral: Secondary | ICD-10-CM | POA: Diagnosis not present

## 2023-02-15 DIAGNOSIS — Z7984 Long term (current) use of oral hypoglycemic drugs: Secondary | ICD-10-CM | POA: Diagnosis not present

## 2023-02-17 DIAGNOSIS — Z933 Colostomy status: Secondary | ICD-10-CM | POA: Diagnosis not present

## 2023-02-17 DIAGNOSIS — C20 Malignant neoplasm of rectum: Secondary | ICD-10-CM | POA: Diagnosis not present

## 2023-06-14 DIAGNOSIS — L821 Other seborrheic keratosis: Secondary | ICD-10-CM | POA: Diagnosis not present

## 2023-06-14 DIAGNOSIS — L820 Inflamed seborrheic keratosis: Secondary | ICD-10-CM | POA: Diagnosis not present

## 2023-06-14 DIAGNOSIS — D0472 Carcinoma in situ of skin of left lower limb, including hip: Secondary | ICD-10-CM | POA: Diagnosis not present

## 2023-06-14 DIAGNOSIS — L570 Actinic keratosis: Secondary | ICD-10-CM | POA: Diagnosis not present

## 2023-07-03 DIAGNOSIS — D509 Iron deficiency anemia, unspecified: Secondary | ICD-10-CM | POA: Diagnosis not present

## 2023-07-03 DIAGNOSIS — Z85038 Personal history of other malignant neoplasm of large intestine: Secondary | ICD-10-CM | POA: Diagnosis not present

## 2023-07-03 DIAGNOSIS — E039 Hypothyroidism, unspecified: Secondary | ICD-10-CM | POA: Diagnosis not present

## 2023-07-03 DIAGNOSIS — E782 Mixed hyperlipidemia: Secondary | ICD-10-CM | POA: Diagnosis not present

## 2023-07-03 DIAGNOSIS — E1129 Type 2 diabetes mellitus with other diabetic kidney complication: Secondary | ICD-10-CM | POA: Diagnosis not present

## 2023-07-11 DIAGNOSIS — R809 Proteinuria, unspecified: Secondary | ICD-10-CM | POA: Diagnosis not present

## 2023-07-11 DIAGNOSIS — R600 Localized edema: Secondary | ICD-10-CM | POA: Diagnosis not present

## 2023-07-11 DIAGNOSIS — I10 Essential (primary) hypertension: Secondary | ICD-10-CM | POA: Diagnosis not present

## 2023-07-11 DIAGNOSIS — D509 Iron deficiency anemia, unspecified: Secondary | ICD-10-CM | POA: Diagnosis not present

## 2023-07-11 DIAGNOSIS — Z79899 Other long term (current) drug therapy: Secondary | ICD-10-CM | POA: Diagnosis not present

## 2023-07-11 DIAGNOSIS — E782 Mixed hyperlipidemia: Secondary | ICD-10-CM | POA: Diagnosis not present

## 2023-07-11 DIAGNOSIS — K219 Gastro-esophageal reflux disease without esophagitis: Secondary | ICD-10-CM | POA: Diagnosis not present

## 2023-07-11 DIAGNOSIS — E039 Hypothyroidism, unspecified: Secondary | ICD-10-CM | POA: Diagnosis not present

## 2023-07-11 DIAGNOSIS — E1129 Type 2 diabetes mellitus with other diabetic kidney complication: Secondary | ICD-10-CM | POA: Diagnosis not present

## 2023-07-11 DIAGNOSIS — Z139 Encounter for screening, unspecified: Secondary | ICD-10-CM | POA: Diagnosis not present

## 2023-07-11 DIAGNOSIS — Z9181 History of falling: Secondary | ICD-10-CM | POA: Diagnosis not present

## 2023-07-11 DIAGNOSIS — G4700 Insomnia, unspecified: Secondary | ICD-10-CM | POA: Diagnosis not present

## 2023-09-24 DIAGNOSIS — L570 Actinic keratosis: Secondary | ICD-10-CM | POA: Diagnosis not present

## 2023-09-24 DIAGNOSIS — L820 Inflamed seborrheic keratosis: Secondary | ICD-10-CM | POA: Diagnosis not present

## 2023-11-07 DIAGNOSIS — Z79899 Other long term (current) drug therapy: Secondary | ICD-10-CM | POA: Diagnosis not present

## 2023-11-07 DIAGNOSIS — G453 Amaurosis fugax: Secondary | ICD-10-CM | POA: Diagnosis not present

## 2023-12-03 DIAGNOSIS — B029 Zoster without complications: Secondary | ICD-10-CM | POA: Diagnosis not present

## 2024-01-09 DIAGNOSIS — E1129 Type 2 diabetes mellitus with other diabetic kidney complication: Secondary | ICD-10-CM | POA: Diagnosis not present

## 2024-01-09 DIAGNOSIS — Z85038 Personal history of other malignant neoplasm of large intestine: Secondary | ICD-10-CM | POA: Diagnosis not present

## 2024-01-09 DIAGNOSIS — E039 Hypothyroidism, unspecified: Secondary | ICD-10-CM | POA: Diagnosis not present

## 2024-01-09 DIAGNOSIS — D509 Iron deficiency anemia, unspecified: Secondary | ICD-10-CM | POA: Diagnosis not present

## 2024-01-09 DIAGNOSIS — E782 Mixed hyperlipidemia: Secondary | ICD-10-CM | POA: Diagnosis not present

## 2024-01-16 DIAGNOSIS — E1121 Type 2 diabetes mellitus with diabetic nephropathy: Secondary | ICD-10-CM | POA: Diagnosis not present

## 2024-01-16 DIAGNOSIS — K219 Gastro-esophageal reflux disease without esophagitis: Secondary | ICD-10-CM | POA: Diagnosis not present

## 2024-01-16 DIAGNOSIS — D509 Iron deficiency anemia, unspecified: Secondary | ICD-10-CM | POA: Diagnosis not present

## 2024-01-16 DIAGNOSIS — R600 Localized edema: Secondary | ICD-10-CM | POA: Diagnosis not present

## 2024-01-16 DIAGNOSIS — Z79899 Other long term (current) drug therapy: Secondary | ICD-10-CM | POA: Diagnosis not present

## 2024-01-16 DIAGNOSIS — E039 Hypothyroidism, unspecified: Secondary | ICD-10-CM | POA: Diagnosis not present

## 2024-01-16 DIAGNOSIS — E782 Mixed hyperlipidemia: Secondary | ICD-10-CM | POA: Diagnosis not present

## 2024-01-16 DIAGNOSIS — G2581 Restless legs syndrome: Secondary | ICD-10-CM | POA: Diagnosis not present

## 2024-01-16 DIAGNOSIS — G4700 Insomnia, unspecified: Secondary | ICD-10-CM | POA: Diagnosis not present

## 2024-01-16 DIAGNOSIS — I10 Essential (primary) hypertension: Secondary | ICD-10-CM | POA: Diagnosis not present

## 2024-01-16 DIAGNOSIS — Z933 Colostomy status: Secondary | ICD-10-CM | POA: Diagnosis not present

## 2024-01-29 DIAGNOSIS — L578 Other skin changes due to chronic exposure to nonionizing radiation: Secondary | ICD-10-CM | POA: Diagnosis not present

## 2024-01-29 DIAGNOSIS — L820 Inflamed seborrheic keratosis: Secondary | ICD-10-CM | POA: Diagnosis not present

## 2024-01-29 DIAGNOSIS — L570 Actinic keratosis: Secondary | ICD-10-CM | POA: Diagnosis not present

## 2024-01-29 DIAGNOSIS — L821 Other seborrheic keratosis: Secondary | ICD-10-CM | POA: Diagnosis not present

## 2024-02-05 DIAGNOSIS — Z7984 Long term (current) use of oral hypoglycemic drugs: Secondary | ICD-10-CM | POA: Diagnosis not present

## 2024-02-05 DIAGNOSIS — H2513 Age-related nuclear cataract, bilateral: Secondary | ICD-10-CM | POA: Diagnosis not present

## 2024-02-05 DIAGNOSIS — E119 Type 2 diabetes mellitus without complications: Secondary | ICD-10-CM | POA: Diagnosis not present

## 2024-02-05 DIAGNOSIS — H40003 Preglaucoma, unspecified, bilateral: Secondary | ICD-10-CM | POA: Diagnosis not present
